# Patient Record
Sex: Female | Born: 1955 | Race: Black or African American | Hispanic: No | Marital: Single | Smoking: Current every day smoker
Health system: Southern US, Community
[De-identification: ages and names within clinical notes are randomized; demographics above are authoritative.]

## PROBLEM LIST (undated history)

## (undated) DIAGNOSIS — E7800 Pure hypercholesterolemia, unspecified: Secondary | ICD-10-CM

## (undated) DIAGNOSIS — I10 Essential (primary) hypertension: Secondary | ICD-10-CM

## (undated) HISTORY — PX: ABDOMINAL HYSTERECTOMY: SHX81

---

## 2012-08-26 DIAGNOSIS — I10 Essential (primary) hypertension: Secondary | ICD-10-CM | POA: Diagnosis present

## 2016-04-17 ENCOUNTER — Emergency Department (HOSPITAL_BASED_OUTPATIENT_CLINIC_OR_DEPARTMENT_OTHER): Payer: 59

## 2016-04-17 ENCOUNTER — Observation Stay (HOSPITAL_BASED_OUTPATIENT_CLINIC_OR_DEPARTMENT_OTHER)
Admission: EM | Admit: 2016-04-17 | Discharge: 2016-04-19 | Disposition: A | Discharge status: Home / Self Care | Payer: 59 | Attending: General Surgery | Admitting: General Surgery

## 2016-04-17 ENCOUNTER — Encounter (HOSPITAL_BASED_OUTPATIENT_CLINIC_OR_DEPARTMENT_OTHER): Payer: Self-pay

## 2016-04-17 DIAGNOSIS — Z7982 Long term (current) use of aspirin: Secondary | ICD-10-CM | POA: Insufficient documentation

## 2016-04-17 DIAGNOSIS — K611 Rectal abscess: Secondary | ICD-10-CM | POA: Diagnosis not present

## 2016-04-17 DIAGNOSIS — F17200 Nicotine dependence, unspecified, uncomplicated: Secondary | ICD-10-CM | POA: Insufficient documentation

## 2016-04-17 DIAGNOSIS — K610 Anal abscess: Secondary | ICD-10-CM

## 2016-04-17 DIAGNOSIS — I10 Essential (primary) hypertension: Secondary | ICD-10-CM | POA: Diagnosis not present

## 2016-04-17 DIAGNOSIS — Z79899 Other long term (current) drug therapy: Secondary | ICD-10-CM | POA: Insufficient documentation

## 2016-04-17 HISTORY — DX: Rectal abscess: K61.1

## 2016-04-17 HISTORY — DX: Essential (primary) hypertension: I10

## 2016-04-17 LAB — BASIC METABOLIC PANEL
ANION GAP: 9 (ref 5–15)
BUN: 12 mg/dL (ref 6–20)
CHLORIDE: 101 mmol/L (ref 101–111)
CO2: 28 mmol/L (ref 22–32)
Calcium: 9.8 mg/dL (ref 8.9–10.3)
Creatinine, Ser: 0.98 mg/dL (ref 0.44–1.00)
GFR calc Af Amer: >60 mL/min (ref >60)
Glucose, Bld: 118 mg/dL — ABNORMAL HIGH (ref 65–99)
POTASSIUM: 3.4 mmol/L — AB (ref 3.5–5.1)
SODIUM: 138 mmol/L (ref 135–145)

## 2016-04-17 LAB — URINALYSIS, ROUTINE W REFLEX MICROSCOPIC
GLUCOSE, UA: NEGATIVE mg/dL
Hgb urine dipstick: NEGATIVE
Ketones, ur: NEGATIVE mg/dL
Leukocytes, UA: NEGATIVE
NITRITE: NEGATIVE
PH: 6 (ref 5.0–8.0)
PROTEIN: 30 mg/dL — AB
Specific Gravity, Urine: 1.023 (ref 1.005–1.030)

## 2016-04-17 LAB — CBC WITH DIFFERENTIAL/PLATELET
Basophils Absolute: 0 10*3/uL (ref 0.0–0.1)
Basophils Relative: 0 %
Eosinophils Absolute: 0.1 10*3/uL (ref 0.0–0.7)
Eosinophils Relative: 1 %
HCT: 34.9 % — ABNORMAL LOW (ref 36.0–46.0)
Hemoglobin: 11.4 g/dL — ABNORMAL LOW (ref 12.0–15.0)
Lymphocytes Relative: 20 %
Lymphs Abs: 2.1 10*3/uL (ref 0.7–4.0)
MCH: 28.8 pg (ref 26.0–34.0)
MCHC: 32.7 g/dL (ref 30.0–36.0)
MCV: 88.1 fL (ref 78.0–100.0)
Monocytes Absolute: 1 10*3/uL (ref 0.1–1.0)
Monocytes Relative: 10 %
Neutro Abs: 7.4 10*3/uL (ref 1.7–7.7)
Neutrophils Relative %: 69 %
Platelets: 328 10*3/uL (ref 150–400)
RBC: 3.96 MIL/uL (ref 3.87–5.11)
RDW: 13.3 % (ref 11.5–15.5)
WBC: 10.6 10*3/uL — ABNORMAL HIGH (ref 4.0–10.5)

## 2016-04-17 LAB — URINALYSIS, MICROSCOPIC (REFLEX)

## 2016-04-17 MED ORDER — MORPHINE SULFATE (PF) 4 MG/ML IV SOLN
INTRAVENOUS | Status: AC
  Administered 2016-04-17: 4 mg via INTRAVENOUS
  Filled 2016-04-17: qty 1

## 2016-04-17 MED ORDER — IOPAMIDOL (ISOVUE-300) INJECTION 61%
100.0000 mL | Freq: Once | INTRAVENOUS | Status: AC | PRN
  Administered 2016-04-17: 100 mL via INTRAVENOUS

## 2016-04-17 MED ORDER — ONDANSETRON HCL 4 MG/2ML IJ SOLN
INTRAMUSCULAR | Status: AC
  Filled 2016-04-17: qty 2

## 2016-04-17 MED ORDER — MORPHINE SULFATE (PF) 4 MG/ML IV SOLN
4.0000 mg | Freq: Once | INTRAVENOUS | Status: AC
  Administered 2016-04-17: 4 mg via INTRAVENOUS

## 2016-04-17 MED ORDER — ONDANSETRON HCL 4 MG/2ML IJ SOLN
4.0000 mg | Freq: Once | INTRAMUSCULAR | Status: AC
  Administered 2016-04-17: 4 mg via INTRAVENOUS

## 2016-04-17 NOTE — ED Notes (Signed)
Pain to right buttocks since Monday and difficulty with urination x 2 days

## 2016-04-17 NOTE — ED Notes (Signed)
Attempted to give report to floor nurse, but unavailable. Per Diplomatic Services operational officersecretary, Drinda ButtsAnnette will have nurse return call.

## 2016-04-17 NOTE — ED Notes (Signed)
Valuables kept :cell phone, pants, shirt, socks, shoes and glasses

## 2016-04-17 NOTE — ED Provider Notes (Signed)
[facility] Provider Note   CSN: [id number] Arrival date & time: 04/17/16  1549   By signing my name below, I, [name], attest that this documentation has been prepared under the direction and in the presence of Newell RubbermaidJeffrey Vermell Madrid, PA-C. Electronically Signed: [name], ED Scribe. 04/17/2016. 12:47 AM.   History   Chief Complaint Chief Complaint  Patient presents with  . Abscess   The history is provided by the patient. No language interpreter was used.   HPI Comments: Wendy Randolph is a 61 y.o. female with PMHx of HTN who presents to the Emergency Department complaining of a moderate, gradually worsening area of pain and swelling to the left buttock x 5 days. Pt states pain is exacerbated with palpation and direct pressure. Pt reports a previous similar abscess to the same area 3 years ago for which she had an I&D. Pt complains of associated difficulty urinating. Pt does not report drainage from the area. Pt denies hx of MRSA. Pt denies fever.    Past Medical History:  Diagnosis Date  . Hypertension     Patient Active Problem List   Diagnosis Date Noted  . Perirectal abscess 04/17/2016    History reviewed. No pertinent surgical history.  OB History    No data available       Home Medications    Prior to Admission medications   Not on File    Family History No family history on file.  Social History Social History  Substance Use Topics  . Smoking status: Current Every Day Smoker  . Smokeless tobacco: Never Used  . Alcohol use Yes     Allergies   Patient has no known allergies.   Review of Systems Review of Systems 10 Systems reviewed and are negative for acute change except as noted in the HPI.   Physical Exam Updated Vital Signs BP 152/72   Pulse 84   Temp 98.8 F (37.1 C) (Oral)   Resp 14   Ht 5\' 7"  (1.702 m)   Wt 68 kg   SpO2 95%   BMI 23.49 kg/m   Physical Exam  Constitutional: She appears  well-developed and well-nourished. No distress.  HENT:  Head: Normocephalic and atraumatic.  Eyes: Conjunctivae are normal.  Cardiovascular: Normal rate.   Pulmonary/Chest: Effort normal.  Abdominal: She exhibits no distension.  Genitourinary:  Genitourinary Comments: Tenderness and firmness to the left perirectal area, no fluctuance noted. Internal exam with severe TTP and fullness along left lateral rectal wall   Neurological: She is alert.  Skin: Skin is warm and dry.  Psychiatric: She has a normal mood and affect.  Nursing note and vitals reviewed.    ED Treatments / Results  DIAGNOSTIC STUDIES:  Oxygen Saturation is 100% on RA, normal by my interpretation.    COORDINATION OF CARE:  5:57 PM Discussed treatment plan with pt at bedside and pt agreed to plan.   Labs (all labs ordered are listed, but only abnormal results are displayed) Labs Reviewed  CBC WITH DIFFERENTIAL/PLATELET - Abnormal; Notable for the following:       Result Value   WBC 10.6 (*)    Hemoglobin 11.4 (*)    HCT 34.9 (*)    All other components within normal limits  URINALYSIS, ROUTINE W REFLEX MICROSCOPIC - Abnormal; Notable for the following:    Color, Urine AMBER (*)    APPearance CLOUDY (*)    Bilirubin Urine SMALL (*)    Protein, ur 30 (*)  All other components within normal limits  URINALYSIS, MICROSCOPIC (REFLEX) - Abnormal; Notable for the following:    Bacteria, UA RARE (*)    Squamous Epithelial / LPF 0-5 (*)    All other components within normal limits  BASIC METABOLIC PANEL - Abnormal; Notable for the following:    Potassium 3.4 (*)    Glucose, Bld 118 (*)    All other components within normal limits    EKG  EKG Interpretation None       Radiology Ct Abdomen Pelvis W Contrast  Result Date: 04/17/2016 CLINICAL DATA:  Left buttock pain and swelling 5 days. Previous abscess in this same area 3 years ago. Difficulty urinating. EXAM: CT ABDOMEN AND PELVIS WITH CONTRAST  TECHNIQUE: Multidetector CT imaging of the abdomen and pelvis was performed using the standard protocol following bolus administration of intravenous contrast. CONTRAST:  ISOVUE-300 IOPAMIDOL (ISOVUE-300) INJECTION 61% COMPARISON:  CT chest 10/14/2015 and CT abdomen/ pelvis 03/21/2006 FINDINGS: Lower chest: Lung bases are within normal. Hepatobiliary: Liver, gallbladder and biliary tree are normal. Pancreas: Within normal. Spleen: Within normal. Adrenals/Urinary Tract: Adrenal glands are normal. Kidneys are normal in size without hydronephrosis or nephrolithiasis. subcentimeter cortical hypodensity over the lower pole left kidney too small to characterize but likely a cyst. Ureters and bladder are normal Stomach/Bowel: Stomach and small bowel are within normal. Appendix is normal. Minimal fatty wall thickening of the cecum/ascending colon without adjacent inflammatory change. There is evidence of a perianal abscess just left of midline measuring 2.3 x 3 x 3 cm and extending slightly superior and anterior to the anorectal junction. Vascular/Lymphatic: Mild calcified plaque throughout the abdominal aorta and iliac arteries. Remaining vascular structures are unremarkable. No significant adenopathy. Reproductive: Previous hysterectomy.  Adnexal regions unremarkable. Other: None. Musculoskeletal: Mild degenerate change of the spine. IMPRESSION: Evidence of a perianal abscess just left of midline extending superiorly and slightly anterior to the anorectal junction. This measures approximately 2.3 x 3 x 3 cm. Evidence of mild fatty wall thickening of the ascending colon as etiology of this abscess may be due to Crohn's disease. Recommend clinical correlation. Minimal diverticulosis of the colon. Aortic atherosclerosis. Subcentimeter left renal cortical hypodensity too small to characterize but likely a cyst. These results were called by telephone at the time of interpretation on 04/17/2016 at 8:56 pm to Dr. Eyvonne Mechanic , who verbally acknowledged these results. Electronically Signed   By: Elberta Fortis M.D.   On: 04/17/2016 20:57    Procedures Procedures (including critical care time)  Medications Ordered in ED Medications  ondansetron (ZOFRAN) 4 MG/2ML injection (not administered)  iopamidol (ISOVUE-300) 61 % injection 100 mL (100 mLs Intravenous Contrast Given 04/17/16 2011)  morphine 4 MG/ML injection 4 mg (4 mg Intravenous Given 04/17/16 2117)  ondansetron (ZOFRAN) injection 4 mg (4 mg Intravenous Given 04/17/16 2116)     Initial Impression / Assessment and Plan / ED Course  I have reviewed the triage vital signs and the nursing notes.  Pertinent labs & imaging results that were available during my care of the patient were reviewed by me and considered in my medical decision making (see chart for details).    Labs: BMP, CBC, urinalysis  Imaging: CT abdomen pelvis with contrast  Consults:   Therapeutics:   Discharge Meds:  Assessment/Plan: 61 year old female presents today with perianal abscess. Patient's abscess was not amenable to I&D in the ED setting. Surgery was consult and who agreed for admission and surgical evaluation. Patient transferred to [facility]  for surgical care.   Final Clinical Impressions(s) / ED Diagnoses   Final diagnoses:  Perianal abscess    New Prescriptions There are no discharge medications for this patient.   I personally performed the services described in this documentation, which was scribed in my presence. The recorded information has been reviewed and is accurate.    Eyvonne Mechanic, PA-C 04/18/16 1610    Canary Brim Tegeler, MD 04/18/16 1102

## 2016-04-17 NOTE — ED Triage Notes (Signed)
Reports abscess to left buttock with increased pain.

## 2016-04-18 ENCOUNTER — Observation Stay (HOSPITAL_COMMUNITY): Payer: 59 | Admitting: Certified Registered Nurse Anesthetist

## 2016-04-18 ENCOUNTER — Encounter (HOSPITAL_COMMUNITY)
Admission: EM | Disposition: A | Discharge status: Home / Self Care | Payer: Self-pay | Source: Home / Self Care | Attending: Emergency Medicine

## 2016-04-18 HISTORY — PX: INCISION AND DRAINAGE PERIRECTAL ABSCESS: SHX1804

## 2016-04-18 LAB — SURGICAL PCR SCREEN
MRSA, PCR: NEGATIVE
STAPHYLOCOCCUS AUREUS: NEGATIVE

## 2016-04-18 SURGERY — INCISION AND DRAINAGE, ABSCESS, PERIRECTAL
Anesthesia: General | Site: Buttocks

## 2016-04-18 MED ORDER — FENTANYL CITRATE (PF) 100 MCG/2ML IJ SOLN
INTRAMUSCULAR | Status: AC
  Filled 2016-04-18: qty 2

## 2016-04-18 MED ORDER — PROPOFOL 10 MG/ML IV BOLUS
INTRAVENOUS | Status: AC
  Filled 2016-04-18: qty 20

## 2016-04-18 MED ORDER — METOPROLOL TARTRATE 5 MG/5ML IV SOLN
5.0000 mg | Freq: Four times a day (QID) | INTRAVENOUS | Status: DC
  Administered 2016-04-18: 5 mg via INTRAVENOUS
  Filled 2016-04-18: qty 5

## 2016-04-18 MED ORDER — POTASSIUM CHLORIDE IN NACL 20-0.45 MEQ/L-% IV SOLN
INTRAVENOUS | Status: DC
  Administered 2016-04-18: 13:00:00 via INTRAVENOUS
  Filled 2016-04-18 (×2): qty 1000

## 2016-04-18 MED ORDER — PROPOFOL 10 MG/ML IV BOLUS
INTRAVENOUS | Status: DC | PRN
  Administered 2016-04-18: 160 mg via INTRAVENOUS

## 2016-04-18 MED ORDER — DEXAMETHASONE SODIUM PHOSPHATE 10 MG/ML IJ SOLN
INTRAMUSCULAR | Status: AC
  Filled 2016-04-18: qty 1

## 2016-04-18 MED ORDER — SODIUM CHLORIDE 0.9 % IJ SOLN
INTRAMUSCULAR | Status: AC
  Filled 2016-04-18: qty 50

## 2016-04-18 MED ORDER — PRAVASTATIN SODIUM 20 MG PO TABS
80.0000 mg | ORAL_TABLET | Freq: Every day | ORAL | Status: DC
  Administered 2016-04-18: 80 mg via ORAL
  Filled 2016-04-18: qty 4

## 2016-04-18 MED ORDER — ONDANSETRON HCL 4 MG/2ML IJ SOLN: 4.0000 mg | Freq: Four times a day (QID) | INTRAMUSCULAR | Status: DC | PRN

## 2016-04-18 MED ORDER — MIDAZOLAM HCL 5 MG/5ML IJ SOLN
INTRAMUSCULAR | Status: DC | PRN
  Administered 2016-04-18: 2 mg via INTRAVENOUS

## 2016-04-18 MED ORDER — DEXTROSE 5 % IV SOLN
2.0000 g | Freq: Two times a day (BID) | INTRAVENOUS | Status: DC
  Administered 2016-04-18: 2 g via INTRAVENOUS
  Filled 2016-04-18 (×2): qty 2

## 2016-04-18 MED ORDER — ONDANSETRON HCL 4 MG/2ML IJ SOLN
INTRAMUSCULAR | Status: DC | PRN
  Administered 2016-04-18: 4 mg via INTRAVENOUS

## 2016-04-18 MED ORDER — BUPIVACAINE LIPOSOME 1.3 % IJ SUSP
20.0000 mL | Freq: Once | INTRAMUSCULAR | Status: AC
  Administered 2016-04-18: 20 mL
  Filled 2016-04-18: qty 20

## 2016-04-18 MED ORDER — LIDOCAINE 2% (20 MG/ML) 5 ML SYRINGE
INTRAMUSCULAR | Status: AC
  Filled 2016-04-18: qty 5

## 2016-04-18 MED ORDER — FENTANYL CITRATE (PF) 100 MCG/2ML IJ SOLN
25.0000 ug | INTRAMUSCULAR | Status: DC | PRN
  Administered 2016-04-18: 50 ug via INTRAVENOUS

## 2016-04-18 MED ORDER — ONDANSETRON 4 MG PO TBDP: 4.0000 mg | ORAL_TABLET | Freq: Four times a day (QID) | ORAL | Status: DC | PRN

## 2016-04-18 MED ORDER — OXYCODONE-ACETAMINOPHEN 5-325 MG PO TABS
1.0000 | ORAL_TABLET | ORAL | Status: DC | PRN
  Filled 2016-04-18: qty 1

## 2016-04-18 MED ORDER — LOSARTAN POTASSIUM 50 MG PO TABS
50.0000 mg | ORAL_TABLET | Freq: Every day | ORAL | Status: DC
  Administered 2016-04-18 – 2016-04-19 (×2): 50 mg via ORAL
  Filled 2016-04-18 (×3): qty 1

## 2016-04-18 MED ORDER — MORPHINE SULFATE (PF) 2 MG/ML IV SOLN
2.0000 mg | INTRAVENOUS | Status: DC | PRN
  Administered 2016-04-18: 2 mg via INTRAVENOUS
  Filled 2016-04-18: qty 1

## 2016-04-18 MED ORDER — DEXAMETHASONE SODIUM PHOSPHATE 10 MG/ML IJ SOLN
INTRAMUSCULAR | Status: DC | PRN
  Administered 2016-04-18: 10 mg via INTRAVENOUS

## 2016-04-18 MED ORDER — ALBUTEROL SULFATE HFA 108 (90 BASE) MCG/ACT IN AERS
INHALATION_SPRAY | RESPIRATORY_TRACT | Status: AC
  Filled 2016-04-18: qty 6.7

## 2016-04-18 MED ORDER — PROMETHAZINE HCL 25 MG/ML IJ SOLN: 6.2500 mg | INTRAMUSCULAR | Status: DC | PRN

## 2016-04-18 MED ORDER — ONDANSETRON HCL 4 MG/2ML IJ SOLN
INTRAMUSCULAR | Status: AC
  Filled 2016-04-18: qty 2

## 2016-04-18 MED ORDER — 0.9 % SODIUM CHLORIDE (POUR BTL) OPTIME
TOPICAL | Status: DC | PRN
  Administered 2016-04-18: 1000 mL

## 2016-04-18 MED ORDER — FENTANYL CITRATE (PF) 100 MCG/2ML IJ SOLN
INTRAMUSCULAR | Status: DC | PRN
  Administered 2016-04-18 (×2): 50 ug via INTRAVENOUS

## 2016-04-18 MED ORDER — ALBUTEROL SULFATE HFA 108 (90 BASE) MCG/ACT IN AERS
INHALATION_SPRAY | RESPIRATORY_TRACT | Status: DC | PRN
  Administered 2016-04-18: 2 via RESPIRATORY_TRACT

## 2016-04-18 MED ORDER — OXYCODONE HCL 5 MG/5ML PO SOLN: 5.0000 mg | Freq: Once | ORAL | Status: DC | PRN

## 2016-04-18 MED ORDER — MIDAZOLAM HCL 2 MG/2ML IJ SOLN
INTRAMUSCULAR | Status: AC
  Filled 2016-04-18: qty 2

## 2016-04-18 MED ORDER — OXYCODONE HCL 5 MG PO TABS: 5.0000 mg | ORAL_TABLET | Freq: Once | ORAL | Status: DC | PRN

## 2016-04-18 MED ORDER — ENOXAPARIN SODIUM 40 MG/0.4ML ~~LOC~~ SOLN
40.0000 mg | SUBCUTANEOUS | Status: DC
  Administered 2016-04-18: 40 mg via SUBCUTANEOUS
  Filled 2016-04-18: qty 0.4

## 2016-04-18 MED ORDER — LIDOCAINE 2% (20 MG/ML) 5 ML SYRINGE
INTRAMUSCULAR | Status: DC | PRN
  Administered 2016-04-18: 80 mg via INTRAVENOUS

## 2016-04-18 MED ORDER — LACTATED RINGERS IV SOLN
INTRAVENOUS | Status: DC
  Administered 2016-04-18: 09:00:00 via INTRAVENOUS

## 2016-04-18 MED ORDER — POTASSIUM CHLORIDE IN NACL 20-0.45 MEQ/L-% IV SOLN
INTRAVENOUS | Status: DC
  Administered 2016-04-18: 02:00:00 via INTRAVENOUS
  Filled 2016-04-18 (×2): qty 1000

## 2016-04-18 SURGICAL SUPPLY — 25 items
BLADE HEX COATED 2.75 (ELECTRODE) ×3 IMPLANT
BLADE SURG 15 STRL LF DISP TIS (BLADE) ×1 IMPLANT
BLADE SURG 15 STRL SS (BLADE) ×2
COVER SURGICAL LIGHT HANDLE (MISCELLANEOUS) IMPLANT
ELECT PENCIL ROCKER SW 15FT (MISCELLANEOUS) ×3 IMPLANT
ELECT REM PT RETURN 9FT ADLT (ELECTROSURGICAL) ×3
ELECTRODE REM PT RTRN 9FT ADLT (ELECTROSURGICAL) ×1 IMPLANT
GAUZE PACKING IODOFORM 1/4X15 (GAUZE/BANDAGES/DRESSINGS) ×3 IMPLANT
GAUZE SPONGE 4X4 12PLY STRL (GAUZE/BANDAGES/DRESSINGS) ×3 IMPLANT
GAUZE SPONGE 4X4 16PLY XRAY LF (GAUZE/BANDAGES/DRESSINGS) ×3 IMPLANT
GLOVE BIOGEL PI IND STRL 7.0 (GLOVE) ×1 IMPLANT
GLOVE BIOGEL PI INDICATOR 7.0 (GLOVE) ×2
GOWN STRL REUS W/TWL LRG LVL3 (GOWN DISPOSABLE) ×3 IMPLANT
GOWN STRL REUS W/TWL XL LVL3 (GOWN DISPOSABLE) ×6 IMPLANT
KIT BASIN OR (CUSTOM PROCEDURE TRAY) ×3 IMPLANT
LUBRICANT JELLY K Y 4OZ (MISCELLANEOUS) ×3 IMPLANT
NEEDLE HYPO 25X1 1.5 SAFETY (NEEDLE) IMPLANT
PACK LITHOTOMY IV (CUSTOM PROCEDURE TRAY) ×3 IMPLANT
PAD ABD 8X10 STRL (GAUZE/BANDAGES/DRESSINGS) ×3 IMPLANT
SOL PREP PROV IODINE SCRUB 4OZ (MISCELLANEOUS) ×3 IMPLANT
SWAB COLLECTION DEVICE MRSA (MISCELLANEOUS) IMPLANT
SYR CONTROL 10ML LL (SYRINGE) IMPLANT
TOWEL OR 17X26 10 PK STRL BLUE (TOWEL DISPOSABLE) ×3 IMPLANT
UNDERPAD 30X30 INCONTINENT (UNDERPADS AND DIAPERS) ×3 IMPLANT
YANKAUER SUCT BULB TIP 10FT TU (MISCELLANEOUS) ×3 IMPLANT

## 2016-04-18 NOTE — Op Note (Signed)
Preoperative Diagnosis: Perianal abscess [K61.0]  Postoprative Diagnosis: Perianal abscess [K61.0]  Procedure: Procedure(s): IRRIGATION AND DRAINAGE PERIRECTAL ABSCESS   Surgeon: Glenna FellowsHoxworth, Adaijah Endres T   Assistants: None  Anesthesia:  General endotracheal anesthesia  Indications: Patient presents with 5 days of worsening left perirectal pain and swelling. She was evaluated in the emergency department and CT scan shows a several centimeter left perirectal abscess. I have recommended incision and drainage in the operating room. The indications of the procedure and its nature, recovery and possible complications have been discussed and documented elsewhere and she agrees to proceed.    Procedure Detail:  Patient was brought to the operating room, placed in the supine position on the operating table and general anesthesia induced. She is carefully positioned in lithotomy position. PAS rate and place. She had received preoperative IV antibiotics. The perineum was widely sterilely prepped and draped. Examination revealed a area of induration in the left lateral perianal . On rectal exam swelling could be felt up 5 or 6 cm proximally. A 1/2 cm incision was made over the area of induration and dissection carried down sharply through subcutaneous tissue alongside of the rectum. Somewhat medially toward the rectal wall and encountered a large cavity with thick white pus and a large amount of pus was evacuated and drained. This opening was enlarged with blunt dissection and cautery to allow wide drainage and any loculations were digitally broken up. The cavity extended about 5 or 6 cm proximally. The cavity was thoroughly irrigated with saline. A perirectal block with Exparel was performed. The cavity was packed with iodoform gauze. Clean gauze dressings were applied. Sponge needle and instrument counts were correct.    Findings: As above  Estimated Blood Loss:  Minimal         Drains: Wound packed with  iodoform gauze  Blood Given: none          Specimens: Culture and sensitivity        Complications:  * No complications entered in OR log *         Disposition: PACU - hemodynamically stable.         Condition: stable

## 2016-04-18 NOTE — Anesthesia Preprocedure Evaluation (Addendum)
Anesthesia Evaluation  Patient identified by MRN, date of birth, ID band Patient awake    Reviewed: Allergy & Precautions, NPO status , Patient's Chart, lab work & pertinent test results  Airway Mallampati: II  TM Distance: >3 FB Neck ROM: Full    Dental no notable dental hx.    Pulmonary Current Smoker,    Pulmonary exam normal breath sounds clear to auscultation       Cardiovascular hypertension, Normal cardiovascular exam Rhythm:Regular Rate:Normal     Neuro/Psych negative neurological ROS  negative psych ROS   GI/Hepatic negative GI ROS, Neg liver ROS,   Endo/Other  negative endocrine ROS  Renal/GU negative Renal ROS  negative genitourinary   Musculoskeletal negative musculoskeletal ROS (+)   Abdominal   Peds negative pediatric ROS (+)  Hematology negative hematology ROS (+)   Anesthesia Other Findings   Reproductive/Obstetrics negative OB ROS                             Anesthesia Physical Anesthesia Plan  ASA: II  Anesthesia Plan: General   Post-op Pain Management:    Induction: Intravenous  Airway Management Planned: LMA and Oral ETT  Additional Equipment:   Intra-op Plan:   Post-operative Plan: Extubation in OR  Informed Consent: I have reviewed the patients History and Physical, chart, labs and discussed the procedure including the risks, benefits and alternatives for the proposed anesthesia with the patient or authorized representative who has indicated his/her understanding and acceptance.   Dental advisory given  Plan Discussed with: CRNA and Surgeon  Anesthesia Plan Comments:         Anesthesia Quick Evaluation

## 2016-04-18 NOTE — Progress Notes (Signed)
Pt refuses new IV placement. IV team as well as myself tried to start a new IV and she said "no it hurts too much." IV fluids are ordered but not being infused. Patient is eating full liquid diet without complications.

## 2016-04-18 NOTE — Transfer of Care (Signed)
Immediate Anesthesia Transfer of Care Note  Patient: Wendy Randolph  Procedure(s) Performed: Procedure(s): IRRIGATION AND DEBRIDEMENT PERIRECTAL ABSCESS (N/A)  Patient Location: PACU  Anesthesia Type:General  Level of Consciousness:  sedated, patient cooperative and responds to stimulation  Airway & Oxygen Therapy:Patient Spontanous Breathing and Patient connected to face mask oxgen  Post-op Assessment:  Report given to PACU RN and Post -op Vital signs reviewed and stable  Post vital signs:  Reviewed and stable  Last Vitals:  Vitals:   04/18/16 0030 04/18/16 0556  BP: (!) 138/47 (!) 113/47  Pulse: 91 77  Resp: 18 18  Temp: 37.1 C 37.1 C    Complications: No apparent anesthesia complications

## 2016-04-18 NOTE — Anesthesia Procedure Notes (Signed)
Procedure Name: LMA Insertion Date/Time: 04/18/2016 9:42 AM Performed by: Wynonia SoursWALKER, Melady Chow L Pre-anesthesia Checklist: Patient identified, Emergency Drugs available, Suction available, Patient being monitored and Timeout performed Patient Re-evaluated:Patient Re-evaluated prior to inductionOxygen Delivery Method: Circle system utilized Preoxygenation: Pre-oxygenation with 100% oxygen Intubation Type: IV induction LMA: LMA with gastric port inserted LMA Size: 4.0 Number of attempts: 1 Placement Confirmation: positive ETCO2,  CO2 detector and breath sounds checked- equal and bilateral Tube secured with: Tape Dental Injury: Teeth and Oropharynx as per pre-operative assessment

## 2016-04-18 NOTE — H&P (Signed)
Wendy Randolph is an 61 y.o. female.    Chief Complaint: Rectal pain  HPI: Patient presents with about 5 days of gradually worsening left-sided buttock or perirectal pain, tenderness and swelling. No drainage. She has had difficulty getting her urine stream started. No change in bowel habits. No drainage or bleeding. She has a history of several buttocks abscess is being drained in the past the last one about 2 years ago. Denies fever or chills.  Past Medical History:  Diagnosis Date  . Hypertension     History reviewed. No pertinent surgical history.  No family history on file. Social History:  reports that she has been smoking.  She has never used smokeless tobacco. She reports that she drinks alcohol. Her drug history is not on file.  Allergies: No Known Allergies  Current Facility-Administered Medications  Medication Dose Route Frequency Provider Last Rate Last Dose  . ondansetron (ZOFRAN) 4 MG/2ML injection            Home medications: Losartan 50 mg daily Aspirin 81 mg daily Pravachol 80 mg daily   Results for orders placed or performed during the hospital encounter of 04/17/16 (from the past 48 hour(s))  Urinalysis, Routine w reflex microscopic     Status: Abnormal   Collection Time: 04/17/16  6:30 PM  Result Value Ref Range   Color, Urine AMBER (A) YELLOW    Comment: BIOCHEMICALS MAY BE AFFECTED BY COLOR   APPearance CLOUDY (A) CLEAR   Specific Gravity, Urine 1.023 1.005 - 1.030   pH 6.0 5.0 - 8.0   Glucose, UA NEGATIVE NEGATIVE mg/dL   Hgb urine dipstick NEGATIVE NEGATIVE   Bilirubin Urine SMALL (A) NEGATIVE   Ketones, ur NEGATIVE NEGATIVE mg/dL   Protein, ur 30 (A) NEGATIVE mg/dL   Nitrite NEGATIVE NEGATIVE   Leukocytes, UA NEGATIVE NEGATIVE  Urinalysis, Microscopic (reflex)     Status: Abnormal   Collection Time: 04/17/16  6:30 PM  Result Value Ref Range   RBC / HPF 0-5 0 - 5 RBC/hpf   WBC, UA 0-5 0 - 5 WBC/hpf   Bacteria, UA RARE (A) NONE SEEN   Squamous  Epithelial / LPF 0-5 (A) NONE SEEN   Mucous PRESENT    Hyaline Casts, UA PRESENT   CBC with Differential     Status: Abnormal   Collection Time: 04/17/16  7:05 PM  Result Value Ref Range   WBC 10.6 (H) 4.0 - 10.5 K/uL   RBC 3.96 3.87 - 5.11 MIL/uL   Hemoglobin 11.4 (L) 12.0 - 15.0 g/dL   HCT 34.9 (L) 36.0 - 46.0 %   MCV 88.1 78.0 - 100.0 fL   MCH 28.8 26.0 - 34.0 pg   MCHC 32.7 30.0 - 36.0 g/dL   RDW 13.3 11.5 - 15.5 %   Platelets 328 150 - 400 K/uL   Neutrophils Relative % 69 %   Neutro Abs 7.4 1.7 - 7.7 K/uL   Lymphocytes Relative 20 %   Lymphs Abs 2.1 0.7 - 4.0 K/uL   Monocytes Relative 10 %   Monocytes Absolute 1.0 0.1 - 1.0 K/uL   Eosinophils Relative 1 %   Eosinophils Absolute 0.1 0.0 - 0.7 K/uL   Basophils Relative 0 %   Basophils Absolute 0.0 0.0 - 0.1 K/uL  Basic metabolic panel     Status: Abnormal   Collection Time: 04/17/16  7:40 PM  Result Value Ref Range   Sodium 138 135 - 145 mmol/L   Potassium 3.4 (L) 3.5 - 5.1  mmol/L   Chloride 101 101 - 111 mmol/L   CO2 28 22 - 32 mmol/L   Glucose, Bld 118 (H) 65 - 99 mg/dL   BUN 12 6 - 20 mg/dL   Creatinine, Ser 0.98 0.44 - 1.00 mg/dL   Calcium 9.8 8.9 - 10.3 mg/dL   GFR calc non Af Amer >60 >60 mL/min   GFR calc Af Amer >60 >60 mL/min    Comment: (NOTE) The eGFR has been calculated using the CKD EPI equation. This calculation has not been validated in all clinical situations. eGFR's persistently <60 mL/min signify possible Chronic Kidney Disease.    Anion gap 9 5 - 15   Ct Abdomen Pelvis W Contrast  Result Date: 04/17/2016 CLINICAL DATA:  Left buttock pain and swelling 5 days. Previous abscess in this same area 3 years ago. Difficulty urinating. EXAM: CT ABDOMEN AND PELVIS WITH CONTRAST TECHNIQUE: Multidetector CT imaging of the abdomen and pelvis was performed using the standard protocol following bolus administration of intravenous contrast. CONTRAST:  135m ISOVUE-300 IOPAMIDOL (ISOVUE-300) INJECTION 61%  COMPARISON:  CT chest 10/14/2015 and CT abdomen/ pelvis 03/21/2006 FINDINGS: Lower chest: Lung bases are within normal. Hepatobiliary: Liver, gallbladder and biliary tree are normal. Pancreas: Within normal. Spleen: Within normal. Adrenals/Urinary Tract: Adrenal glands are normal. Kidneys are normal in size without hydronephrosis or nephrolithiasis. subcentimeter cortical hypodensity over the lower pole left kidney too small to characterize but likely a cyst. Ureters and bladder are normal Stomach/Bowel: Stomach and small bowel are within normal. Appendix is normal. Minimal fatty wall thickening of the cecum/ascending colon without adjacent inflammatory change. There is evidence of a perianal abscess just left of midline measuring 2.3 x 3 x 3 cm and extending slightly superior and anterior to the anorectal junction. Vascular/Lymphatic: Mild calcified plaque throughout the abdominal aorta and iliac arteries. Remaining vascular structures are unremarkable. No significant adenopathy. Reproductive: Previous hysterectomy.  Adnexal regions unremarkable. Other: None. Musculoskeletal: Mild degenerate change of the spine. IMPRESSION: Evidence of a perianal abscess just left of midline extending superiorly and slightly anterior to the anorectal junction. This measures approximately 2.3 x 3 x 3 cm. Evidence of mild fatty wall thickening of the ascending colon as etiology of this abscess may be due to Crohn's disease. Recommend clinical correlation. Minimal diverticulosis of the colon. Aortic atherosclerosis. Subcentimeter left renal cortical hypodensity too small to characterize but likely a cyst. These results were called by telephone at the time of interpretation on 04/17/2016 at 8:56 pm to Dr. JOkey Regal, who verbally acknowledged these results. Electronically Signed   By: DMarin OlpM.D.   On: 04/17/2016 20:57    Review of Systems  Constitutional: Negative for chills and fever.  Respiratory: Negative.    Cardiovascular: Negative.   Gastrointestinal: Negative for abdominal pain, blood in stool, constipation, diarrhea, nausea and vomiting.  Genitourinary:       Difficulty voiding    Blood pressure (!) 138/47, pulse 91, temperature 98.7 F (37.1 C), temperature source Oral, resp. rate 18, height 5' 7"  (1.702 m), weight 73.4 kg (161 lb 13.1 oz), SpO2 96 %. Physical Exam  General: Alert, mildly obese African-American female, in no distress Skin: Warm and dry without rash or infection. HEENT: No palpable masses or thyromegaly. Sclera nonicteric.  Lymph nodes: No cervical, supraclavicular, or inguinal nodes palpable. Lungs: Breath sounds clear and equal without increased work of breathing Cardiovascular: Regular rate and rhythm without murmur. No JVD or edema. Peripheral pulses intact. Abdomen: Nondistended. Soft and nontender. No  masses palpable. No organomegaly. No palpable hernias. Rectal: External rectal exam reveals mild swelling and induration and tenderness in the left perirectal space. There are several healed scars over the lateral left buttocks. Extremities: No edema or joint swelling or deformity. No chronic venous stasis changes. Neurologic: Alert and fully oriented. Affect normal. No gross motor deficits.  Assessment/Plan Perirectal abscess. She has had previous buttock abscesses but these seem well lateral to the rectum and likely were skin abscesses. Patient is being started on IV antibiotics and plan for urgent incision and drainage. I discussed the procedure in nature the problem with the patient including expected recovery and risks of anesthetic complications, bleeding and infection.  Edward Jolly, MD 04/18/2016, 1:07 AM

## 2016-04-19 ENCOUNTER — Encounter (HOSPITAL_COMMUNITY): Payer: Self-pay | Admitting: General Surgery

## 2016-04-19 MED ORDER — LOSARTAN POTASSIUM 50 MG PO TABS
50.0000 mg | ORAL_TABLET | Freq: Once | ORAL | Status: AC
  Administered 2016-04-19: 50 mg via ORAL

## 2016-04-19 MED ORDER — OXYCODONE-ACETAMINOPHEN 5-325 MG PO TABS
1.0000 | ORAL_TABLET | Freq: Three times a day (TID) | ORAL | 0 refills | Status: DC | PRN
Start: 2016-04-19 — End: 2022-05-09

## 2016-04-19 MED ORDER — AMOXICILLIN-POT CLAVULANATE 875-125 MG PO TABS: 1.0000 | ORAL_TABLET | Freq: Two times a day (BID) | ORAL | 0 refills | Status: DC

## 2016-04-19 NOTE — Anesthesia Postprocedure Evaluation (Addendum)
Anesthesia Post Note  Patient: [name]  Procedure(s) Performed: Procedure(s) (LRB): IRRIGATION AND DEBRIDEMENT PERIRECTAL ABSCESS (N/A)  Patient location during evaluation: PACU Anesthesia Type: General Level of consciousness: awake and alert Pain management: pain level controlled Vital Signs Assessment: post-procedure vital signs reviewed and stable Respiratory status: spontaneous breathing, nonlabored ventilation, respiratory function stable and patient connected to nasal cannula oxygen Cardiovascular status: blood pressure returned to baseline and stable Postop Assessment: no signs of nausea or vomiting Anesthetic complications: no       Last Vitals:  Vitals:   04/19/16 0505 04/19/16 0657  BP: (!) 162/78 (!) 157/66  Pulse: 94   Resp: 18   Temp: 36.4 C     Last Pain:  Vitals:   04/19/16 0505  TempSrc: Oral  PainSc:                  Ebin Palazzi S

## 2016-04-19 NOTE — Progress Notes (Signed)
Patient ID: Wendy Randolph, female   DOB: 09-26-1955, 61 y.o.   MRN: [id number]  [facility]  1 Day Post-Op  Subjective: Feeling well this morning. Tolerating soft diet. Pain well controlled. Wants to go home.  Objective: Vital signs in last 24 hours: Temp:  [97.6 F (36.4 C)-98.6 F (37 C)] 97.6 F (36.4 C) (01/29 0505) Pulse Rate:  [66-94] 94 (01/29 0505) Resp:  [11-18] 18 (01/29 0505) BP: (124-162)/(64-100) 157/66 (01/29 0657) SpO2:  [91 %-100 %] 98 % (01/29 0505) Last BM Date: 04/18/16  Intake/Output from previous day: 01/28 0701 - 01/29 0700 In: 1883.3 [P.O.:720; I.V.:1163.3] Out: 2100 [Urine:2100] Intake/Output this shift: No intake/output data recorded.  PE: Gen:  Alert, NAD, pleasant Pulm:  Effort normal Abd: Soft, NT/ND, +BS Perineum: left sided perianal abscess s/p I&D >> packing removed, no drainage, no induration  Lab Results:   Recent Labs  04/17/16 1905  WBC 10.6*  HGB 11.4*  HCT 34.9*  PLT 328   BMET  Recent Labs  04/17/16 1940  NA 138  K 3.4*  CL 101  CO2 28  GLUCOSE 118*  BUN 12  CREATININE 0.98  CALCIUM 9.8   PT/INR No results for input(s): LABPROT, INR in the last 72 hours. CMP     Component Value Date/Time   NA 138 04/17/2016 1940   K 3.4 (L) 04/17/2016 1940   CL 101 04/17/2016 1940   CO2 28 04/17/2016 1940   GLUCOSE 118 (H) 04/17/2016 1940   BUN 12 04/17/2016 1940   CREATININE 0.98 04/17/2016 1940   CALCIUM 9.8 04/17/2016 1940   GFRNONAA >60 04/17/2016 1940   GFRAA >60 04/17/2016 1940   Lipase  No results found for: LIPASE     Studies/Results: Ct Abdomen Pelvis W Contrast  Result Date: 04/17/2016 CLINICAL DATA:  Left buttock pain and swelling 5 days. Previous abscess in this same area 3 years ago. Difficulty urinating. EXAM: CT ABDOMEN AND PELVIS WITH CONTRAST TECHNIQUE: Multidetector CT imaging of the abdomen and pelvis was performed using the standard protocol following bolus  administration of intravenous contrast. CONTRAST:  100mL ISOVUE-300 IOPAMIDOL (ISOVUE-300) INJECTION 61% COMPARISON:  CT chest 10/14/2015 and CT abdomen/ pelvis 03/21/2006 FINDINGS: Lower chest: Lung bases are within normal. Hepatobiliary: Liver, gallbladder and biliary tree are normal. Pancreas: Within normal. Spleen: Within normal. Adrenals/Urinary Tract: Adrenal glands are normal. Kidneys are normal in size without hydronephrosis or nephrolithiasis. subcentimeter cortical hypodensity over the lower pole left kidney too small to characterize but likely a cyst. Ureters and bladder are normal Stomach/Bowel: Stomach and small bowel are within normal. Appendix is normal. Minimal fatty wall thickening of the cecum/ascending colon without adjacent inflammatory change. There is evidence of a perianal abscess just left of midline measuring 2.3 x 3 x 3 cm and extending slightly superior and anterior to the anorectal junction. Vascular/Lymphatic: Mild calcified plaque throughout the abdominal aorta and iliac arteries. Remaining vascular structures are unremarkable. No significant adenopathy. Reproductive: Previous hysterectomy.  Adnexal regions unremarkable. Other: None. Musculoskeletal: Mild degenerate change of the spine. IMPRESSION: Evidence of a perianal abscess just left of midline extending superiorly and slightly anterior to the anorectal junction. This measures approximately 2.3 x 3 x 3 cm. Evidence of mild fatty wall thickening of the ascending colon as etiology of this abscess may be due to Crohn's disease. Recommend clinical correlation. Minimal diverticulosis of the colon. Aortic atherosclerosis. Subcentimeter left renal cortical hypodensity too small to characterize but likely a cyst. These results were called by  telephone at the time of interpretation on 04/17/2016 at 8:56 pm to Dr. Eyvonne Mechanic , who verbally acknowledged these results. Electronically Signed   By: Elberta Fortis M.D.   On: 04/17/2016 20:57     Anti-infectives: Anti-infectives    Start     Dose/Rate Route Frequency Ordered Stop   04/18/16 0200  cefoTEtan (CEFOTAN) 2 g in dextrose 5 % 50 mL IVPB  Status:  Discontinued     2 g 100 mL/hr over 30 Minutes Intravenous Every 12 hours 04/18/16 0133 04/18/16 1132       Assessment/Plan S/p IRRIGATION AND DRAINAGE PERIRECTAL ABSCESS 1/28 Dr. Johna Sheriff - POD 1 - culture pending, so far no organisms seen  ID - cefotetan x1 perioperative FEN - regular diet VTE - SCDs, lovenox  Plan - packing removed. Start sitz baths BID. Ready for discharge. Patient will be on augmentin x1 week and she will follow-up with Dr. Johna Sheriff in 1-2 weeks.   LOS: 1 day    Edson Snowball , [facility] 04/19/2016, 9:37 AM Pager: [phone] Consults: [phone] Mon-Fri 7:00 am-4:30 pm Sat-Sun 7:00 am-11:30 am

## 2016-04-19 NOTE — Progress Notes (Signed)
MD paged about patient BP 162/78 pulse 94 and feeling like she has a head cold. Pt is due to restart blood pressure medications at 10 in the morning. Received verbal orders to give losartan now and keep on schedule for 10am.

## 2016-04-19 NOTE — Discharge Summary (Signed)
[facility] Discharge Summary   Patient ID: Wendy EeCarolyn Byrom MRN: [id number] DOB/AGE: 1956-02-09 61 y.o.  Admit date: 04/17/2016 Discharge date: 04/19/2016  Admitting Diagnosis: Perianal abscess  Discharge Diagnosis Patient Active Problem List   Diagnosis Date Noted  . Perirectal abscess 04/17/2016    Consultants None  Imaging: Ct Abdomen Pelvis W Contrast  Result Date: 04/17/2016 CLINICAL DATA:  Left buttock pain and swelling 5 days. Previous abscess in this same area 3 years ago. Difficulty urinating. EXAM: CT ABDOMEN AND PELVIS WITH CONTRAST TECHNIQUE: Multidetector CT imaging of the abdomen and pelvis was performed using the standard protocol following bolus administration of intravenous contrast. CONTRAST:  100mL ISOVUE-300 IOPAMIDOL (ISOVUE-300) INJECTION 61% COMPARISON:  CT chest 10/14/2015 and CT abdomen/ pelvis 03/21/2006 FINDINGS: Lower chest: Lung bases are within normal. Hepatobiliary: Liver, gallbladder and biliary tree are normal. Pancreas: Within normal. Spleen: Within normal. Adrenals/Urinary Tract: Adrenal glands are normal. Kidneys are normal in size without hydronephrosis or nephrolithiasis. subcentimeter cortical hypodensity over the lower pole left kidney too small to characterize but likely a cyst. Ureters and bladder are normal Stomach/Bowel: Stomach and small bowel are within normal. Appendix is normal. Minimal fatty wall thickening of the cecum/ascending colon without adjacent inflammatory change. There is evidence of a perianal abscess just left of midline measuring 2.3 x 3 x 3 cm and extending slightly superior and anterior to the anorectal junction. Vascular/Lymphatic: Mild calcified plaque throughout the abdominal aorta and iliac arteries. Remaining vascular structures are unremarkable. No significant adenopathy. Reproductive: Previous hysterectomy.  Adnexal regions unremarkable. Other: None. Musculoskeletal: Mild degenerate change of the spine.  IMPRESSION: Evidence of a perianal abscess just left of midline extending superiorly and slightly anterior to the anorectal junction. This measures approximately 2.3 x 3 x 3 cm. Evidence of mild fatty wall thickening of the ascending colon as etiology of this abscess may be due to Crohn's disease. Recommend clinical correlation. Minimal diverticulosis of the colon. Aortic atherosclerosis. Subcentimeter left renal cortical hypodensity too small to characterize but likely a cyst. These results were called by telephone at the time of interpretation on 04/17/2016 at 8:56 pm to Dr. Eyvonne MechanicJEFFREY HEDGES , who verbally acknowledged these results. Electronically Signed   By: Elberta Fortisaniel  Boyle M.D.   On: 04/17/2016 20:57    Procedures Dr. Johna SheriffHoxworth (04/19/16) - Irrigation and drainage perirectal abscess  Hospital Course:  Wendy Randolph is a 61yo female [facility] 5 days of gradually worsening left-sided buttock pain.  She has a history of several buttocks abscess being drained in the past, the last one about 2 years ago. Workup showed perirectal abscess.  Patient was admitted for IV antibiotics, and she underwent procedure listed above.  Tolerated procedure well and was transferred to the floor.  Diet was advanced as tolerated.  On POD1 packing was removed from wound. The patient was voiding well, tolerating diet, ambulating well, pain well controlled, vital signs stable and felt stable for discharge home. She will start sitz baths and be on augmentin x1 week.  Patient will follow up in our office in 2 weeks and knows to call with questions or concerns.  She will call to confirm appointment date/time.    Physical Exam: Gen:  Alert, NAD, pleasant Pulm:  Effort normal Abd: Soft, NT/ND, +BS Perineum: left sided perianal abscess s/p I&D >> packing removed, no drainage, no induration  Allergies as of 04/19/2016   No Known Allergies     Medication List    TAKE these medications  amoxicillin-clavulanate 875-125 MG tablet Commonly known as:  AUGMENTIN Take 1 tablet by mouth 2 (two) times daily.   aspirin EC 81 MG tablet Take 81 mg by mouth daily.   losartan 50 MG tablet Commonly known as:  COZAAR Take 50 mg by mouth daily.   naphazoline-glycerin 0.012-0.2 % Soln Commonly known as:  CLEAR EYES Place 1-2 drops into both eyes 4 (four) times daily as needed for irritation.   oxyCODONE-acetaminophen 5-325 MG tablet Commonly known as:  PERCOCET/ROXICET Take 1-2 tablets by mouth every 8 (eight) hours as needed for moderate pain.   pravastatin 80 MG tablet Commonly known as:  PRAVACHOL Take 80 mg by mouth daily.        Follow-up Information    Mariella Saa, MD. Call.   Specialty:  General Surgery Why:  We are working on your appointment, please call to confirm. We would like for you to see Dr. Johna Sheriff in about 2 weeks. Contact information: [address] [place] [address] [phone]           Signed: Edson Snowball, [facility] 04/19/2016, 10:16 AM Pager: [phone] Consults: [phone] Mon-Fri 7:00 am-4:30 pm Sat-Sun 7:00 am-11:30 am

## 2016-04-19 NOTE — Discharge Instructions (Signed)
Disposable Sitz Bath °Introduction °A disposable sitz bath is a plastic basin that fits over the toilet. A bag is hung above the toilet, and the bag is connected to a tube that opens into the basin. The bag is filled with warm water that flows into the basin through the tube. A sitz bath can be used to help relieve symptoms, clean, and promote healing in the genital and anal areas, as well as in the lower abdomen and buttocks. °What are the risks? °Sitz baths are generally very safe. It is possible for the skin between the genitals and the anus (perineum) to become infected, but this is rare. You can avoid this by cleaning your sitz bath supplies thoroughly. °How to use a disposable sitz bath °1. Close the clamp on the tube. Make sure the clamp is closed tightly to prevent leakage. °2. Fill the sitz bath basin and the plastic bag with warm water. The water should be warm enough to be comfortable, but not hot. °3. Raise the toilet seat and place the filled basin on the toilet. Make sure the overflow opening is facing toward the back of the toilet. °¨ If you prefer, you may place the empty basin on the toilet first, and then use the plastic bag to fill the basin with warm water. °4. Hang the filled plastic bag overhead on a hook or towel rack close to the toilet. The bag should be higher than the toilet so that the water will flow down through the tube. °5. Attach the tube to the opening on the basin. Make sure that the tube is attached to the basin tightly to prevent leakage. °6. Sit on the basin and release the clamp. This will allow warm water to flow into the basin and flush the area around your genitals and anus. °7. Remain sitting on the basin for about 15-20 minutes, or as long as told by your health care provider. °8. Stand up and gently pat your skin dry. If directed, apply clean bandages (dressings) to the affected area as told by your health care provider. °9. Carefully remove the basin from the toilet seat  and tip the basin into the toilet to empty any remaining water. Empty any remaining water from the plastic bag into the toilet. Then, flush the toilet. °10. Wash the basin with warm water and soap. Let the basin air dry in the sink. You should also let the plastic bag and the tubing air dry. °11. Store the basin, tubing, and plastic bag in a clean, dry area. °12. Wash your hands with soap and water. If soap and water are not available, use hand sanitizer. °Contact a health care provider if: °· You have symptoms that get worse instead of better. °· You develop new skin irritation, redness, or swelling around your genitals or anus. °This information is not intended to replace advice given to you by your health care provider. Make sure you discuss any questions you have with your health care provider. °Document Released: 09/07/2011 Document Revised: 08/14/2015 Document Reviewed: 01/26/2015 °© 2017 Elsevier ° °

## 2016-04-25 LAB — AEROBIC/ANAEROBIC CULTURE (SURGICAL/DEEP WOUND)

## 2016-04-25 LAB — AEROBIC/ANAEROBIC CULTURE W GRAM STAIN (SURGICAL/DEEP WOUND)

## 2016-08-23 NOTE — Addendum Note (Signed)
Addendum  created 08/23/16 1032 by Zaylie Gisler, MD   Sign clinical note    

## 2018-07-26 IMAGING — CT CT ABD-PELV W/ CM
2 of 5 series · 15 of 46 positions shown, 17 images · IV contrast (iopamidol)
Comparison: CT chest 10/14/2015 and CT abdomen/ pelvis 03/21/2006

CLINICAL DATA: Left buttock pain and swelling 5 days. Previous
abscess in this same area 3 years ago. Difficulty urinating.

EXAM:
CT ABDOMEN AND PELVIS WITH CONTRAST
TECHNIQUE: Multidetector CT imaging of the abdomen and pelvis was performed
using the standard protocol following bolus administration of
intravenous contrast.
CONTRAST:  100mL VW0PXE-T55 IOPAMIDOL (VW0PXE-T55) INJECTION 61%

[Series 2: axial st · axial · 0.81mm/px · z∈[-881,-471]mm · 12 of 92 slices shown, 14 images]
[im 5/92  soft-tissue]
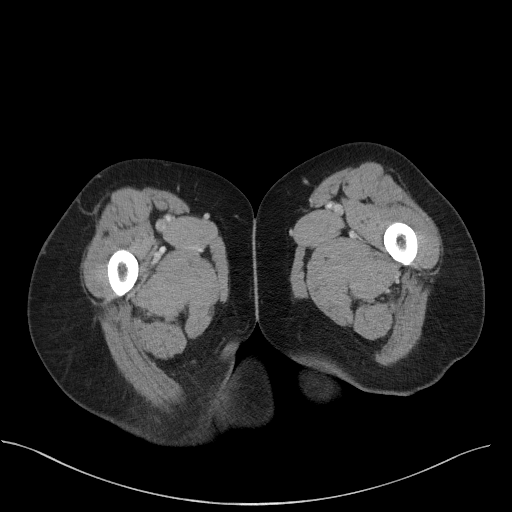
[im 5/92  bone]
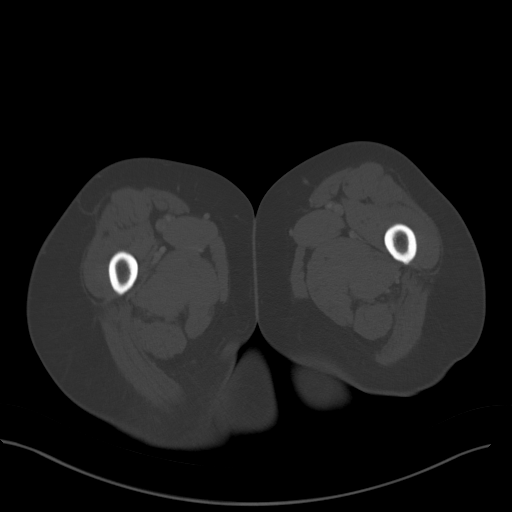
[im 14/92  soft-tissue]
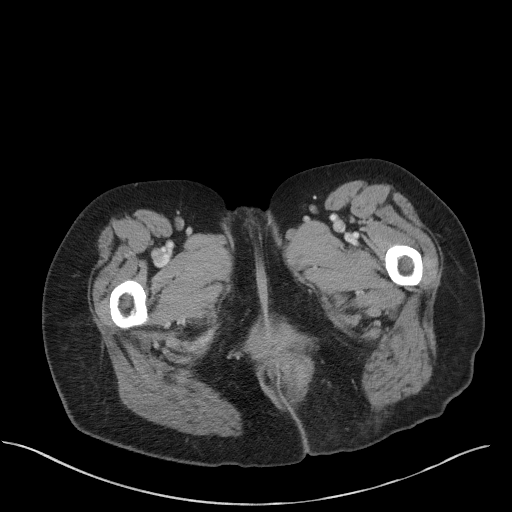
[im 19/92  soft-tissue]
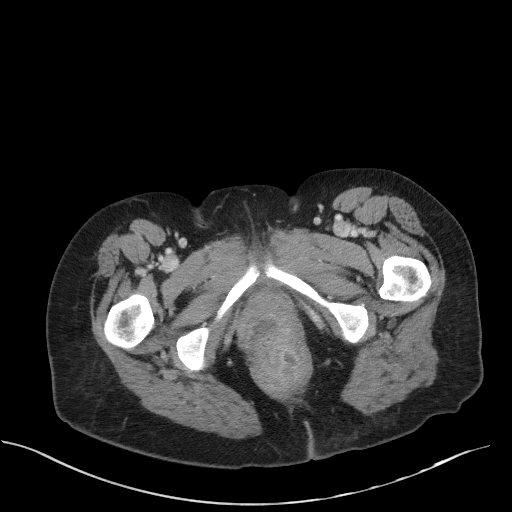
[im 28/92  soft-tissue]
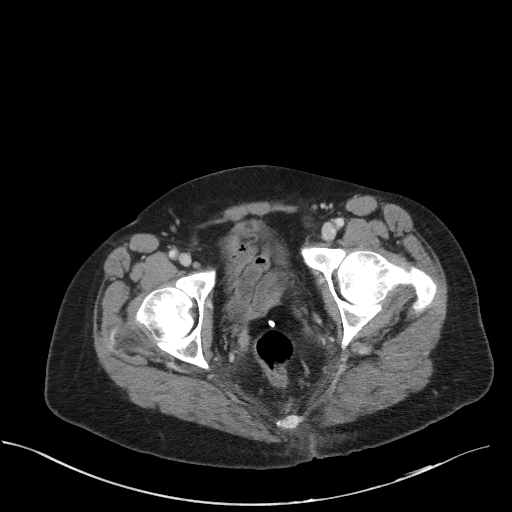
[im 37/92  soft-tissue]
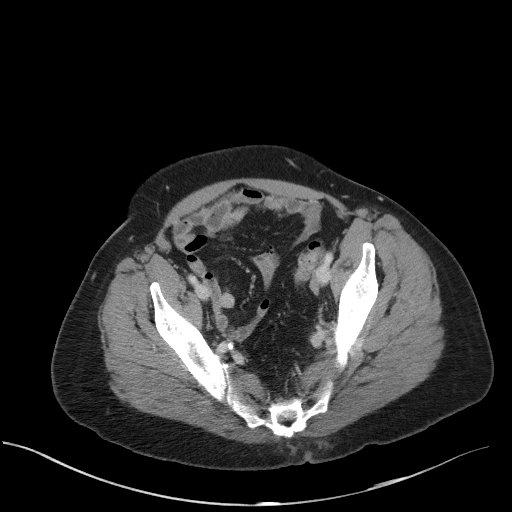
[im 41/92  soft-tissue]
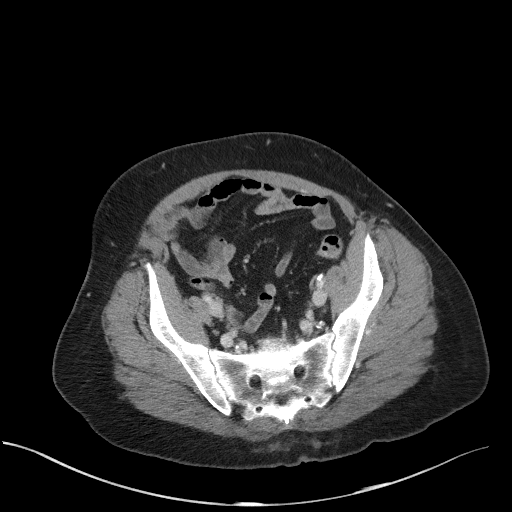
[im 51/92  soft-tissue]
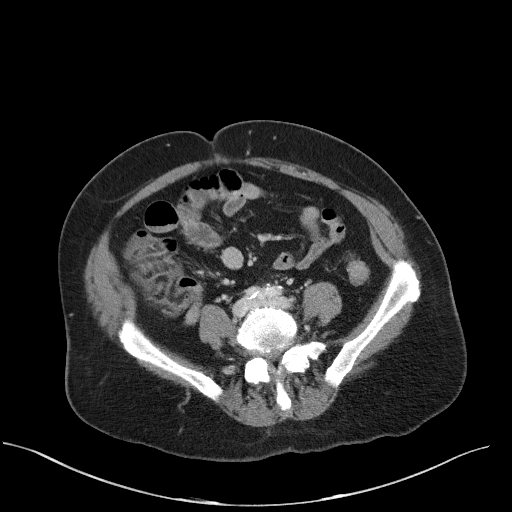
[im 55/92  soft-tissue]
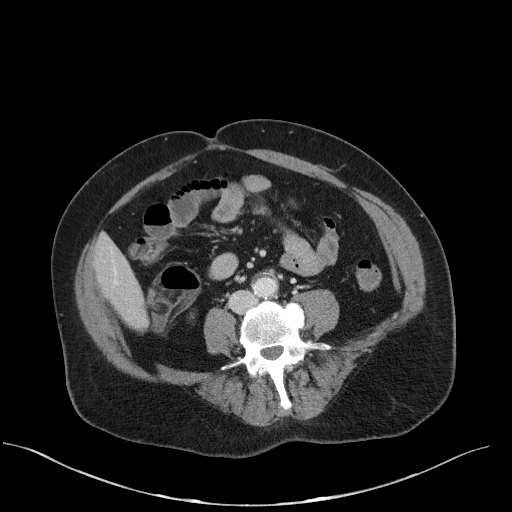
[im 64/92  soft-tissue]
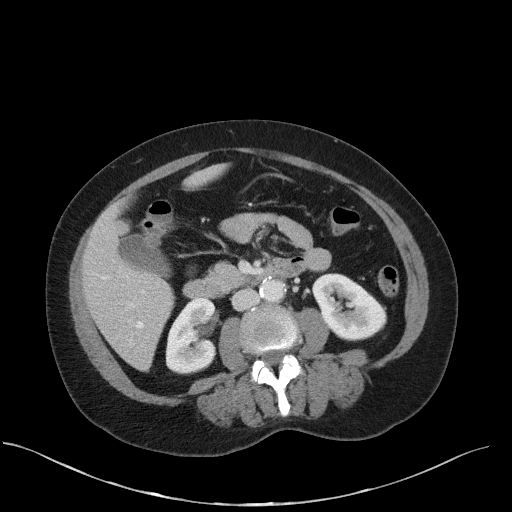
[im 64/92  bone]
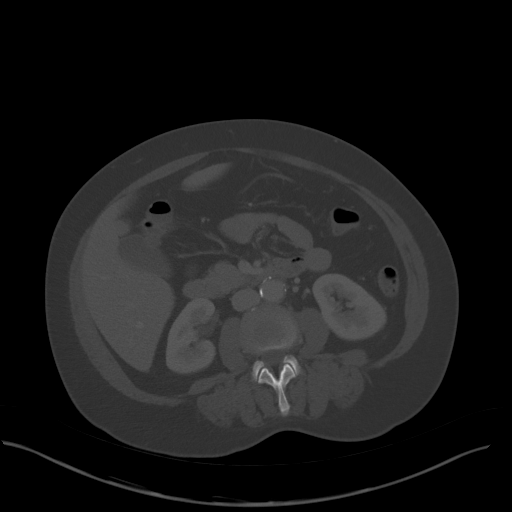
[im 73/92  soft-tissue]
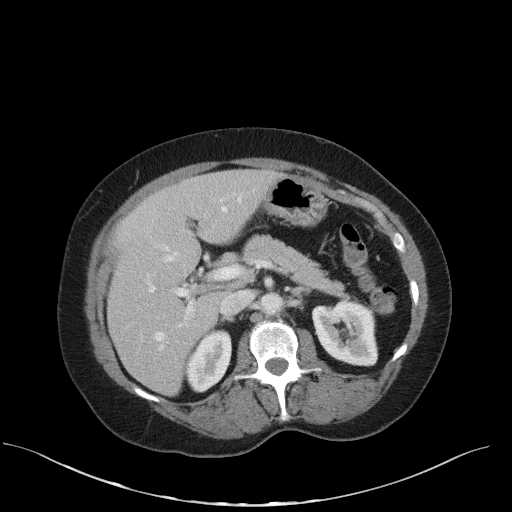
[im 78/92  soft-tissue]
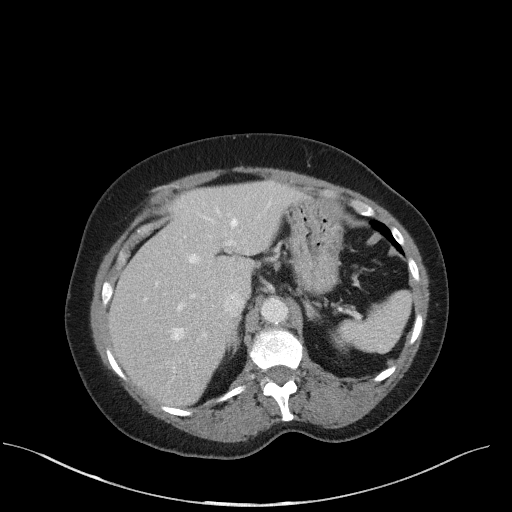
[im 87/92  soft-tissue]
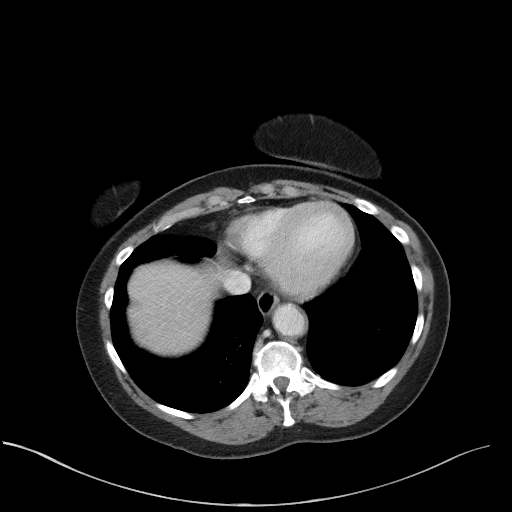

[Series 5: coronal st · coronal · 0.67mm/px · 3 of 105 slices shown]
[im 35/105  soft-tissue]
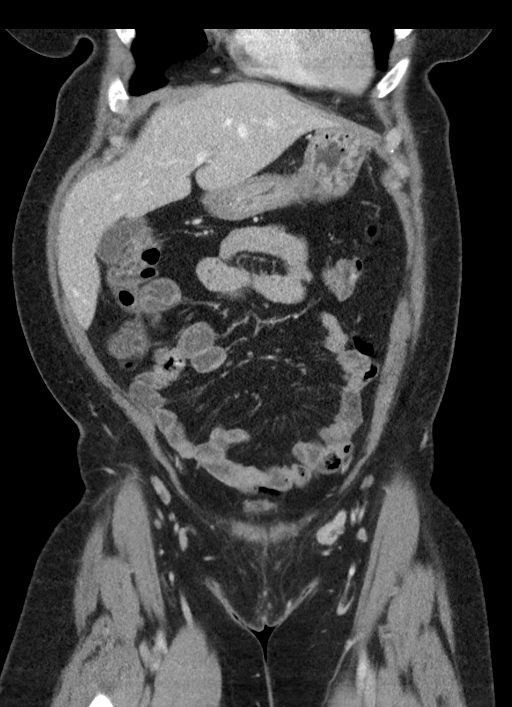
[im 47/105  soft-tissue]
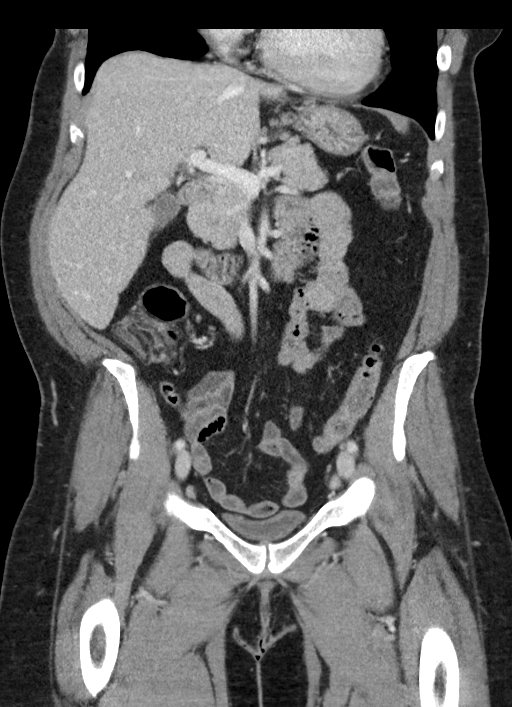
[im 58/105  soft-tissue]
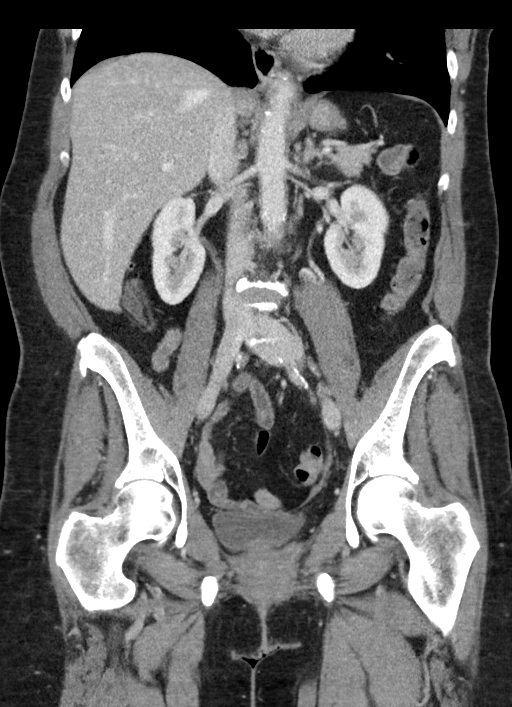

[15 of 46 positions shown; findings below may reference images not displayed]

FINDINGS: Lower chest: Lung bases are within normal.

Hepatobiliary: Liver, gallbladder and biliary tree are normal.

Pancreas: Within normal.

Spleen: Within normal.

Adrenals/Urinary Tract: Adrenal glands are normal. Kidneys are
normal in size without hydronephrosis or nephrolithiasis.
subcentimeter cortical hypodensity over the lower pole left kidney
too small to characterize but likely a cyst. Ureters and bladder are
normal

Stomach/Bowel: Stomach and small bowel are within normal. Appendix
is normal.

Minimal fatty wall thickening of the cecum/ascending colon without
adjacent inflammatory change. There is evidence of a perianal
abscess just left of midline measuring 2.3 x 3 x 3 cm and extending
slightly superior and anterior to the anorectal junction.

Vascular/Lymphatic: Mild calcified plaque throughout the abdominal
aorta and iliac arteries. Remaining vascular structures are
unremarkable. No significant adenopathy.

Reproductive: Previous hysterectomy.  Adnexal regions unremarkable.

Other: None.

Musculoskeletal: Mild degenerate change of the spine.
IMPRESSION: Evidence of a perianal abscess just left of midline extending
superiorly and slightly anterior to the anorectal junction. This
measures approximately 2.3 x 3 x 3 cm. Evidence of mild fatty wall
thickening of the ascending colon as etiology of this abscess may be
due to Crohn's disease. Recommend clinical correlation.

Minimal diverticulosis of the colon.

Aortic atherosclerosis.

Subcentimeter left renal cortical hypodensity too small to
characterize but likely a cyst.

These results were called by telephone at the time of interpretation
on 04/17/2016 at [DATE] to Dr. LENG GERHART , who verbally
acknowledged these results.

## 2019-03-20 DIAGNOSIS — E782 Mixed hyperlipidemia: Secondary | ICD-10-CM | POA: Diagnosis present

## 2020-05-22 LAB — COLOGUARD: COLOGUARD: NEGATIVE

## 2021-02-20 DIAGNOSIS — K219 Gastro-esophageal reflux disease without esophagitis: Secondary | ICD-10-CM | POA: Insufficient documentation

## 2022-01-11 ENCOUNTER — Emergency Department (HOSPITAL_BASED_OUTPATIENT_CLINIC_OR_DEPARTMENT_OTHER)
Admission: EM | Admit: 2022-01-11 | Discharge: 2022-01-11 | Disposition: A | Discharge status: Home / Self Care | Payer: Medicare Other | Attending: Emergency Medicine | Admitting: Emergency Medicine

## 2022-01-11 ENCOUNTER — Emergency Department (HOSPITAL_BASED_OUTPATIENT_CLINIC_OR_DEPARTMENT_OTHER): Payer: Medicare Other

## 2022-01-11 ENCOUNTER — Other Ambulatory Visit: Payer: Self-pay

## 2022-01-11 ENCOUNTER — Encounter (HOSPITAL_BASED_OUTPATIENT_CLINIC_OR_DEPARTMENT_OTHER): Payer: Self-pay | Admitting: Emergency Medicine

## 2022-01-11 DIAGNOSIS — W010XXA Fall on same level from slipping, tripping and stumbling without subsequent striking against object, initial encounter: Secondary | ICD-10-CM | POA: Diagnosis not present

## 2022-01-11 DIAGNOSIS — M25561 Pain in right knee: Secondary | ICD-10-CM | POA: Diagnosis present

## 2022-01-11 DIAGNOSIS — Y9301 Activity, walking, marching and hiking: Secondary | ICD-10-CM | POA: Insufficient documentation

## 2022-01-11 HISTORY — DX: Pure hypercholesterolemia, unspecified: E78.00

## 2022-01-11 NOTE — ED Triage Notes (Signed)
Patient presents POV with R knee pain after tripping and landing on R knee. No obvious deformity, ambulatory. Has not attempted any OTC measures for pain relief.

## 2022-01-11 NOTE — ED Provider Notes (Signed)
[facility] EMERGENCY DEPARTMENT Provider Note   CSN: [id number] Arrival date & time: 01/11/22  0848     History  Chief Complaint  Patient presents with   Knee Pain    Wendy Randolph is a 66 y.o. female.  66 yo F with a chief complaints of right knee pain after fall.  Patient was walking up her stairs a couple days ago and lost her balance and fell.  Complaining of pain to that knee.  Has been able to ambulate without issue.  Denies other injury in the fall.  Has never had problems with this knee before.  Has been feeling some popping and clicks and was concerned so came here for evaluation.   Knee Pain      Home Medications Prior to Admission medications   Medication Sig Start Date End Date Taking? Authorizing Provider  amoxicillin-clavulanate (AUGMENTIN) 875-125 MG tablet Take 1 tablet by mouth 2 (two) times daily. 04/19/16   Meuth, Brooke A, PA-C  losartan (COZAAR) 50 MG tablet Take 50 mg by mouth daily.  09/26/15 09/25/16  [provider]  naphazoline-glycerin (CLEAR EYES) 0.012-0.2 % SOLN Place 1-2 drops into both eyes 4 (four) times daily as needed for irritation.    [provider]  oxyCODONE-acetaminophen (PERCOCET/ROXICET) 5-325 MG tablet Take 1-2 tablets by mouth every 8 (eight) hours as needed for moderate pain. 04/19/16   Meuth, Brooke A, PA-C  pravastatin (PRAVACHOL) 80 MG tablet Take 80 mg by mouth daily.  09/29/15 09/28/16  [provider]      Allergies    Patient has no known allergies.    Review of Systems   Review of Systems  Physical Exam Updated Vital Signs BP (!) 188/88 (BP Location: Right Arm)   Pulse 89   Temp 98.5 F (36.9 C) (Oral)   Resp 16   Ht 5\' 7"  (1.702 m)   Wt 61.2 kg   SpO2 98%   BMI 21.14 kg/m  Physical Exam Vitals and nursing note reviewed.  Constitutional:      General: She is not in acute distress.    Appearance: She is well-developed. She is not diaphoretic.  HENT:     Head: Normocephalic  and atraumatic.  Eyes:     Pupils: Pupils are equal, round, and reactive to light.  Cardiovascular:     Rate and Rhythm: Normal rate and regular rhythm.     Heart sounds: No murmur heard.    No friction rub. No gallop.  Pulmonary:     Effort: Pulmonary effort is normal.     Breath sounds: No wheezing or rales.  Abdominal:     General: There is no distension.     Palpations: Abdomen is soft.     Tenderness: There is no abdominal tenderness.  Musculoskeletal:        General: No tenderness.     Cervical back: Normal range of motion and neck supple.     Comments: Pulse motor and sensation intact.  Full range of motion without significant tenderness.  She does have some mild ligamentous laxity with medial distraction of the knee.  Skin:    General: Skin is warm and dry.  Neurological:     Mental Status: She is alert and oriented to person, place, and time.  Psychiatric:        Behavior: Behavior normal.     ED Results / Procedures / Treatments   Labs (all labs ordered are listed, but only abnormal results are displayed) Labs  Reviewed - No data to display  EKG None  Radiology DG Knee Complete 4 Views Right  Result Date: 01/11/2022 CLINICAL DATA:  Right knee pain after fall 2 days ago. EXAM: RIGHT KNEE - COMPLETE 4+ VIEW COMPARISON:  None Available. FINDINGS: Mild suprapatellar joint effusion is noted. 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 Osteophyte formation is noted. No definite fracture or dislocation is noted. IMPRESSION: Mild degenerative changes as described above. Mild suprapatellar joint effusion. No fracture or dislocation. Electronically Signed   By: Marijo Conception M.D.   On: 01/11/2022 09:33    Procedures Procedures    Medications Ordered in ED Medications - No data to display  ED Course/ Medical Decision Making/ A&P                           Medical Decision Making Amount and/or Complexity of Data Reviewed Radiology: ordered.   66 yo F with  a chief complaint of right knee pain.  This is after a nonsyncopal fall.  She does have signs that she could have an MCL injury on exam.  Plain film independently interpreted by me without fracture or dislocation.  Will place in a knee immobilizer.  Crutches.  Sports medicine follow-up.  10:22 AM:  I have discussed the diagnosis/risks/treatment options with the patient.  Evaluation and diagnostic testing in the emergency department does not suggest an emergent condition requiring admission or immediate intervention beyond what has been performed at this time.  They will follow up with PCP. We also discussed returning to the ED immediately if new or worsening sx occur. We discussed the sx which are most concerning (e.g., sudden worsening pain, fever, inability to tolerate by mouth) that necessitate immediate return. Medications administered to the patient during their visit and any new prescriptions provided to the patient are listed below.  Medications given during this visit Medications - No data to display   The patient appears reasonably screen and/or stabilized for discharge and I doubt any other medical condition or other [facility] requiring further screening, evaluation, or treatment in the ED at this time prior to discharge.          Final Clinical Impression(s) / ED Diagnoses Final diagnoses:  Acute pain of right knee    Rx / DC Orders ED Discharge Orders     None         Deno Etienne, DO 01/11/22 1022

## 2022-01-11 NOTE — Discharge Instructions (Signed)
Take 4 over the counter ibuprofen tablets 3 times a day or 2 over-the-counter naproxen tablets twice a day for pain. Also take tylenol 1000mg (2 extra strength) four times a day.   Follow-up with your doctor in the office.  If you are having ongoing pain they may consider further imaging or referral.

## 2022-04-12 ENCOUNTER — Encounter (HOSPITAL_BASED_OUTPATIENT_CLINIC_OR_DEPARTMENT_OTHER): Payer: Self-pay | Admitting: *Deleted

## 2022-04-12 ENCOUNTER — Emergency Department (HOSPITAL_BASED_OUTPATIENT_CLINIC_OR_DEPARTMENT_OTHER): Payer: Medicare Other

## 2022-04-12 ENCOUNTER — Other Ambulatory Visit: Payer: Self-pay

## 2022-04-12 ENCOUNTER — Emergency Department (HOSPITAL_BASED_OUTPATIENT_CLINIC_OR_DEPARTMENT_OTHER)
Admission: EM | Admit: 2022-04-12 | Discharge: 2022-04-12 | Disposition: A | Discharge status: Home / Self Care | Payer: Medicare Other | Attending: Emergency Medicine | Admitting: Emergency Medicine

## 2022-04-12 DIAGNOSIS — S39012A Strain of muscle, fascia and tendon of lower back, initial encounter: Secondary | ICD-10-CM | POA: Insufficient documentation

## 2022-04-12 DIAGNOSIS — Z79899 Other long term (current) drug therapy: Secondary | ICD-10-CM | POA: Insufficient documentation

## 2022-04-12 DIAGNOSIS — I10 Essential (primary) hypertension: Secondary | ICD-10-CM | POA: Insufficient documentation

## 2022-04-12 DIAGNOSIS — X58XXXA Exposure to other specified factors, initial encounter: Secondary | ICD-10-CM | POA: Diagnosis not present

## 2022-04-12 DIAGNOSIS — S3992XA Unspecified injury of lower back, initial encounter: Secondary | ICD-10-CM | POA: Diagnosis present

## 2022-04-12 DIAGNOSIS — M1991 Primary osteoarthritis, unspecified site: Secondary | ICD-10-CM | POA: Insufficient documentation

## 2022-04-12 DIAGNOSIS — M479 Spondylosis, unspecified: Secondary | ICD-10-CM

## 2022-04-12 MED ORDER — IBUPROFEN 600 MG PO TABS: 600.0000 mg | ORAL_TABLET | Freq: Three times a day (TID) | ORAL | 0 refills | Status: AC | PRN

## 2022-04-12 MED ORDER — IBUPROFEN 600 MG PO TABS: 600.0000 mg | ORAL_TABLET | Freq: Three times a day (TID) | ORAL | 0 refills | Status: DC | PRN

## 2022-04-12 NOTE — ED Provider Notes (Signed)
[facility] Provider Note   CSN: [id number] Arrival date & time: 04/12/22  1019     History  Chief Complaint  Patient presents with   Back Pain    Wendy Randolph is a 67 y.o. female with PMH HTN and  HLD presenting to the ED for lower back pain for the last 2 weeks.  Patient describes pain as like a muscle spasm to lateral aspects of both sides of lower back.  She denies any fall, direct trauma, or other injury to the back or recent increased activity.  She saw her PCP at the onset of pain and was given 6-day course of prednisone as well as Flexeril 3 times daily.  She completed the prednisone but states that this did not help her pain.  She states that she never took the Flexeril and has not taken any other medication including Tylenol or ibuprofen to help her pain.  She states that the pain is worse with twisting of the trunk.  She denies numbness, tingling, weakness, fever, chills, abdominal pain, nausea, vomiting, dysuria, hematuria, bowel or bladder dysfunction, history of cancer, IV drug use, or any other symptoms at this time.    Home Medications Prior to Admission medications   Medication Sig Start Date End Date Taking? Authorizing Provider  ibuprofen (ADVIL) 600 MG tablet Take 1 tablet (600 mg total) by mouth every 8 (eight) hours as needed for up to 5 days for mild pain or moderate pain. 04/12/22 04/17/22 Yes Felicidad Sugarman L, PA-C  amoxicillin-clavulanate (AUGMENTIN) 875-125 MG tablet Take 1 tablet by mouth 2 (two) times daily. 04/19/16   Meuth, Brooke A, PA-C  losartan (COZAAR) 50 MG tablet Take 50 mg by mouth daily.  09/26/15 09/25/16  [provider]  naphazoline-glycerin (CLEAR EYES) 0.012-0.2 % SOLN Place 1-2 drops into both eyes 4 (four) times daily as needed for irritation.    [provider]  oxyCODONE-acetaminophen (PERCOCET/ROXICET) 5-325 MG tablet Take 1-2 tablets by mouth every 8 (eight) hours as needed for  moderate pain. 04/19/16   Meuth, Brooke A, PA-C  pravastatin (PRAVACHOL) 80 MG tablet Take 80 mg by mouth daily.  09/29/15 09/28/16  [provider]      Allergies    Patient has no known allergies.    Review of Systems   Review of Systems  Constitutional:  Negative for activity change, appetite change, chills, diaphoresis and fever.  HENT:  Negative for ear pain and sore throat.   Eyes:  Negative for pain and visual disturbance.  Respiratory:  Negative for cough and shortness of breath.   Cardiovascular:  Negative for chest pain, palpitations and leg swelling.  Gastrointestinal:  Negative for abdominal pain, constipation, diarrhea, nausea and vomiting.  Genitourinary:  Negative for dysuria and hematuria.  Musculoskeletal:  Positive for back pain. Negative for arthralgias, joint swelling, neck pain and neck stiffness.  Skin:  Negative for color change and rash.  Neurological:  Negative for dizziness, seizures, syncope, facial asymmetry, weakness, light-headedness, numbness and headaches.  Psychiatric/Behavioral:  Negative for confusion.   All other systems reviewed and are negative.   Physical Exam Updated Vital Signs BP (!) 188/81 (BP Location: Right Arm)   Pulse 72   Temp 98.3 F (36.8 C) (Oral)   Resp 19   Ht 5\' 6"  (1.676 m)   Wt 60.3 kg   SpO2 100%   BMI 21.47 kg/m  Physical Exam Vitals and nursing note reviewed.  Constitutional:  General: She is not in acute distress.    Appearance: She is well-developed. She is not ill-appearing or toxic-appearing.  HENT:     Head: Normocephalic and atraumatic.  Eyes:     Conjunctiva/sclera: Conjunctivae normal.  Cardiovascular:     Rate and Rhythm: Normal rate and regular rhythm.     Heart sounds: No murmur heard. Pulmonary:     Effort: Pulmonary effort is normal. No respiratory distress.     Breath sounds: Normal breath sounds. No wheezing, rhonchi or rales.  Chest:     Chest wall: No tenderness.  Abdominal:      General: Abdomen is flat.     Palpations: Abdomen is soft.     Tenderness: There is no abdominal tenderness. There is no guarding or rebound.  Musculoskeletal:        General: No deformity. Normal range of motion.     Cervical back: Normal range of motion and neck supple. No rigidity or tenderness.     Right lower leg: No edema.     Left lower leg: No edema.     Comments: No midline CTL spine tenderness, no stepoffs or other deformities, range of motion intact, able to changes positions with ease, steady gait, no tenderness over paraspinal muscles, no tenderness over hips bilaterally, negative straight leg raise  Skin:    General: Skin is warm and dry.     Capillary Refill: Capillary refill takes less than 2 seconds.  Neurological:     General: No focal deficit present.     Mental Status: She is alert and oriented to person, place, and time.     Cranial Nerves: No cranial nerve deficit.     Sensory: No sensory deficit.     Motor: No weakness.     Gait: Gait normal.  Psychiatric:        Mood and Affect: Mood normal.        Behavior: Behavior normal.     ED Results / Procedures / Treatments   Labs (all labs ordered are listed, but only abnormal results are displayed) Labs Reviewed - No data to display  EKG None  Radiology DG Lumbar Spine Complete  Result Date: 04/12/2022 CLINICAL DATA:  Low back pain over the last 2 weeks EXAM: LUMBAR SPINE - COMPLETE 4+ VIEW COMPARISON:  None Available. FINDINGS: No malalignment. No regional fracture. No disc space narrowing. Ordinary endplate osteophytes in the lower lumbar spine. No significant facet arthropathy. No pars defect. Aortic atherosclerosis incidentally noted. 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 IMPRESSION: No acute or traumatic finding. Ordinary endplate osteophytes in the lower lumbar spine. Some left SI joint arthritis. Electronically Signed   By: Nelson Chimes M.D.   On: 04/12/2022 11:12     Procedures Procedures    Medications Ordered in ED Medications - No data to display  ED Course/ Medical Decision Making/ A&P                             Medical Decision Making Amount and/or Complexity of Data Reviewed Radiology: ordered. Decision-making details documented in ED Course.  Risk Prescription drug management.   67 year old female presenting to ED for evaluation of non-traumatic lower back pain.The emergent differential diagnosis for back pain includes but is not limited to fracture, muscle strain, cauda equina, spinal stenosis. DDD, ankylosing spondylitis, acute ligamentous injury, disk herniation, spondylolisthesis, epidural compression syndrome, metastatic cancer, transverse myelitis, vertebral osteomyelitis,  diskitis, kidney stone, pyelonephritis, AAA, Perforated ulcer, Retrocecal appendicitis, pancreatitis, bowel obstruction, retroperitoneal hemorrhage or mass, meningitis. Pt has no red flag symptoms for back pain, is neurologically intact, has benign abdominal exam, she is hypertensive but otherwise vital signs unremarkable, and she has no other associated tenderness or injury to back. Presentation most consistent with muscle strain of lower back likely complicated by arthritic changes in her age group. Previously evaluated by PCP as well though did not take muscle relaxers given at that time. X-ray without any acute bony changes, pt remains neurologically intact. Discussed plan to discharge home and she will take Flexeril previously prescribed as well as ibuprofen/Tylenol and use of a heating pad for better pain management. Also provided with back exercises to help strengthen muscles in back and prevent re-injury. Strictly instructed to follow up with PCP in 1 week for re-evaluation and aware they may choose to do further imaging, refer to physical therapy, or otherwise treat back pain if continued. Given strict ED return precautions. All questions answered and stable for  discharge.          Final Clinical Impression(s) / ED Diagnoses Final diagnoses:  Strain of lumbar region, initial encounter  Osteoarthritis of back    Rx / DC Orders ED Discharge Orders          Ordered    ibuprofen (ADVIL) 600 MG tablet  Every 8 hours PRN        04/12/22 1121              Tonette Lederer, PA-C 04/12/22 1139    Alvira Monday, MD 04/13/22 2147

## 2022-04-12 NOTE — ED Triage Notes (Signed)
Here for evaluation of back pain, mid lower pain, onset approx 2 weeks ago, lying down has relief, moving at times, getting out of bed, will cause increase in spasm like pain. Denies any numbness or tingling in lower extremities. Has not taken any pain meds.

## 2022-04-12 NOTE — Discharge Instructions (Addendum)
Thank you for letting us take care of you today.   Your x-ray showed some arthritis but no broken or out of place bones.  Please take the Flexeril previously prescribed by your primary care provider. I recommend taking it 3 times a day for the next 2-3 days until you start noticing improvement in your symptoms and then as needed. I also recommend you take ibuprofen 600mg  3 times a day for the next 2-3 days to help manage your pain and after that as needed unless otherwise instructed by your doctor. You may take Tylenol and/or use a heating pad as well for any breakthrough pain.   I provided some exercises that may help strengthen your back, relieve your pain, and help prevent you from re-injuring it in the future. Please do these as tolerated.  Follow up with your primary care provider in 1 week to discuss your visit today or sooner for any worsening symptoms.  If you develop worsening, severe pain in your back, weakness in your legs, start using the bathroom on yourself, fever, uncontrollable vomiting, blood in your urine, or other concerns, please be re-evaluated at nearest emergency department.

## 2022-05-09 ENCOUNTER — Other Ambulatory Visit: Payer: Self-pay

## 2022-05-09 ENCOUNTER — Inpatient Hospital Stay (HOSPITAL_BASED_OUTPATIENT_CLINIC_OR_DEPARTMENT_OTHER)
Admission: EM | Admit: 2022-05-09 | Discharge: 2022-05-20 | DRG: 673 | Disposition: A | Discharge status: Home Health | Payer: Medicare Other | Attending: Family Medicine | Admitting: Family Medicine

## 2022-05-09 ENCOUNTER — Emergency Department (HOSPITAL_BASED_OUTPATIENT_CLINIC_OR_DEPARTMENT_OTHER): Payer: Medicare Other

## 2022-05-09 ENCOUNTER — Encounter (HOSPITAL_BASED_OUTPATIENT_CLINIC_OR_DEPARTMENT_OTHER): Payer: Self-pay | Admitting: Emergency Medicine

## 2022-05-09 DIAGNOSIS — N179 Acute kidney failure, unspecified: Secondary | ICD-10-CM | POA: Diagnosis present

## 2022-05-09 DIAGNOSIS — R197 Diarrhea, unspecified: Secondary | ICD-10-CM | POA: Insufficient documentation

## 2022-05-09 DIAGNOSIS — R768 Other specified abnormal immunological findings in serum: Secondary | ICD-10-CM | POA: Diagnosis present

## 2022-05-09 DIAGNOSIS — E872 Acidosis, unspecified: Secondary | ICD-10-CM | POA: Diagnosis present

## 2022-05-09 DIAGNOSIS — F17210 Nicotine dependence, cigarettes, uncomplicated: Secondary | ICD-10-CM | POA: Diagnosis present

## 2022-05-09 DIAGNOSIS — B349 Viral infection, unspecified: Secondary | ICD-10-CM | POA: Diagnosis present

## 2022-05-09 DIAGNOSIS — K219 Gastro-esophageal reflux disease without esophagitis: Secondary | ICD-10-CM | POA: Diagnosis present

## 2022-05-09 DIAGNOSIS — M549 Dorsalgia, unspecified: Secondary | ICD-10-CM | POA: Diagnosis present

## 2022-05-09 DIAGNOSIS — D509 Iron deficiency anemia, unspecified: Secondary | ICD-10-CM | POA: Diagnosis present

## 2022-05-09 DIAGNOSIS — D696 Thrombocytopenia, unspecified: Secondary | ICD-10-CM | POA: Diagnosis not present

## 2022-05-09 DIAGNOSIS — Z8711 Personal history of peptic ulcer disease: Secondary | ICD-10-CM | POA: Diagnosis not present

## 2022-05-09 DIAGNOSIS — K922 Gastrointestinal hemorrhage, unspecified: Secondary | ICD-10-CM | POA: Diagnosis not present

## 2022-05-09 DIAGNOSIS — E782 Mixed hyperlipidemia: Secondary | ICD-10-CM | POA: Diagnosis present

## 2022-05-09 DIAGNOSIS — K828 Other specified diseases of gallbladder: Secondary | ICD-10-CM | POA: Diagnosis present

## 2022-05-09 DIAGNOSIS — Y92009 Unspecified place in unspecified non-institutional (private) residence as the place of occurrence of the external cause: Secondary | ICD-10-CM | POA: Diagnosis not present

## 2022-05-09 DIAGNOSIS — K649 Unspecified hemorrhoids: Secondary | ICD-10-CM | POA: Diagnosis present

## 2022-05-09 DIAGNOSIS — K838 Other specified diseases of biliary tract: Secondary | ICD-10-CM | POA: Diagnosis present

## 2022-05-09 DIAGNOSIS — D72825 Bandemia: Secondary | ICD-10-CM | POA: Diagnosis present

## 2022-05-09 DIAGNOSIS — R7989 Other specified abnormal findings of blood chemistry: Secondary | ICD-10-CM | POA: Diagnosis not present

## 2022-05-09 DIAGNOSIS — R748 Abnormal levels of other serum enzymes: Secondary | ICD-10-CM | POA: Diagnosis not present

## 2022-05-09 DIAGNOSIS — K625 Hemorrhage of anus and rectum: Secondary | ICD-10-CM

## 2022-05-09 DIAGNOSIS — E871 Hypo-osmolality and hyponatremia: Secondary | ICD-10-CM | POA: Diagnosis present

## 2022-05-09 DIAGNOSIS — Z79899 Other long term (current) drug therapy: Secondary | ICD-10-CM | POA: Diagnosis not present

## 2022-05-09 DIAGNOSIS — A09 Infectious gastroenteritis and colitis, unspecified: Secondary | ICD-10-CM | POA: Diagnosis not present

## 2022-05-09 DIAGNOSIS — E860 Dehydration: Secondary | ICD-10-CM | POA: Diagnosis present

## 2022-05-09 DIAGNOSIS — N19 Unspecified kidney failure: Secondary | ICD-10-CM | POA: Diagnosis not present

## 2022-05-09 DIAGNOSIS — K7200 Acute and subacute hepatic failure without coma: Secondary | ICD-10-CM | POA: Diagnosis present

## 2022-05-09 DIAGNOSIS — I10 Essential (primary) hypertension: Secondary | ICD-10-CM | POA: Diagnosis present

## 2022-05-09 DIAGNOSIS — E8729 Other acidosis: Secondary | ICD-10-CM | POA: Diagnosis not present

## 2022-05-09 DIAGNOSIS — N281 Cyst of kidney, acquired: Secondary | ICD-10-CM | POA: Diagnosis present

## 2022-05-09 DIAGNOSIS — Z992 Dependence on renal dialysis: Secondary | ICD-10-CM | POA: Diagnosis not present

## 2022-05-09 DIAGNOSIS — N170 Acute kidney failure with tubular necrosis: Secondary | ICD-10-CM | POA: Diagnosis present

## 2022-05-09 DIAGNOSIS — T39395A Adverse effect of other nonsteroidal anti-inflammatory drugs [NSAID], initial encounter: Secondary | ICD-10-CM | POA: Diagnosis present

## 2022-05-09 DIAGNOSIS — R7303 Prediabetes: Secondary | ICD-10-CM | POA: Diagnosis present

## 2022-05-09 DIAGNOSIS — D649 Anemia, unspecified: Secondary | ICD-10-CM | POA: Diagnosis not present

## 2022-05-09 DIAGNOSIS — E876 Hypokalemia: Secondary | ICD-10-CM | POA: Diagnosis present

## 2022-05-09 DIAGNOSIS — Z90710 Acquired absence of both cervix and uterus: Secondary | ICD-10-CM

## 2022-05-09 LAB — COMPREHENSIVE METABOLIC PANEL
ALT: 913 U/L — ABNORMAL HIGH (ref 0–44)
AST: 608 U/L — ABNORMAL HIGH (ref 15–41)
Albumin: 3 g/dL — ABNORMAL LOW (ref 3.5–5.0)
Alkaline Phosphatase: 770 U/L — ABNORMAL HIGH (ref 38–126)
Anion gap: 18 — ABNORMAL HIGH (ref 5–15)
BUN: 104 mg/dL — ABNORMAL HIGH (ref 8–23)
CO2: 15 mmol/L — ABNORMAL LOW (ref 22–32)
Calcium: 8.9 mg/dL (ref 8.9–10.3)
Chloride: 99 mmol/L (ref 98–111)
Creatinine, Ser: 13.89 mg/dL — ABNORMAL HIGH (ref 0.44–1.00)
GFR, Estimated: 3 mL/min — ABNORMAL LOW (ref >60)
Glucose, Bld: 103 mg/dL — ABNORMAL HIGH (ref 70–99)
Potassium: 4.2 mmol/L (ref 3.5–5.1)
Sodium: 132 mmol/L — ABNORMAL LOW (ref 135–145)
Total Bilirubin: 20 mg/dL (ref 0.3–1.2)
Total Protein: 7.1 g/dL (ref 6.5–8.1)

## 2022-05-09 LAB — BILIRUBIN, DIRECT: Bilirubin, Direct: 16.2 mg/dL — ABNORMAL HIGH (ref 0.0–0.2)

## 2022-05-09 LAB — LIPASE, BLOOD: Lipase: 1118 U/L — ABNORMAL HIGH (ref 11–51)

## 2022-05-09 LAB — MRSA NEXT GEN BY PCR, NASAL: MRSA by PCR Next Gen: NOT DETECTED

## 2022-05-09 MED ORDER — SODIUM CHLORIDE 0.9 % IV SOLN: INTRAVENOUS | Status: DC

## 2022-05-09 MED ORDER — ONDANSETRON HCL 4 MG PO TABS: 4.0000 mg | ORAL_TABLET | Freq: Four times a day (QID) | ORAL | Status: DC | PRN

## 2022-05-09 MED ORDER — ONDANSETRON HCL 4 MG/2ML IJ SOLN
4.0000 mg | Freq: Once | INTRAMUSCULAR | Status: AC
  Administered 2022-05-09: 4 mg via INTRAVENOUS
  Filled 2022-05-09: qty 2

## 2022-05-09 MED ORDER — LABETALOL HCL 5 MG/ML IV SOLN
10.0000 mg | INTRAVENOUS | Status: DC | PRN
  Administered 2022-05-18: 10 mg via INTRAVENOUS
  Filled 2022-05-09: qty 4

## 2022-05-09 MED ORDER — SODIUM CHLORIDE 0.9 % IV BOLUS
2000.0000 mL | Freq: Once | INTRAVENOUS | Status: AC
  Administered 2022-05-09: 2000 mL via INTRAVENOUS

## 2022-05-09 MED ORDER — ONDANSETRON HCL 4 MG/2ML IJ SOLN: 4.0000 mg | Freq: Four times a day (QID) | INTRAMUSCULAR | Status: DC | PRN

## 2022-05-09 MED ORDER — SODIUM CHLORIDE 0.9 % IV BOLUS
1000.0000 mL | Freq: Once | INTRAVENOUS | Status: AC
  Administered 2022-05-09: 1000 mL via INTRAVENOUS

## 2022-05-09 MED ORDER — HEPARIN SODIUM (PORCINE) 5000 UNIT/ML IJ SOLN
5000.0000 [IU] | Freq: Three times a day (TID) | INTRAMUSCULAR | Status: DC
  Administered 2022-05-09 – 2022-05-10 (×2): 5000 [IU] via SUBCUTANEOUS
  Filled 2022-05-09 (×2): qty 1

## 2022-05-09 MED ORDER — SODIUM BICARBONATE 650 MG PO TABS
650.0000 mg | ORAL_TABLET | Freq: Two times a day (BID) | ORAL | Status: DC
  Administered 2022-05-09: 650 mg via ORAL
  Filled 2022-05-09: qty 1

## 2022-05-09 MED ORDER — NAPHAZOLINE-GLYCERIN 0.012-0.25 % OP SOLN: 1.0000 [drp] | Freq: Four times a day (QID) | OPHTHALMIC | Status: DC | PRN

## 2022-05-09 NOTE — H&P (Signed)
History and Physical    Wendy Randolph [id number] DOB: May 30, 1955 DOA: 05/09/2022  DOS: the patient was seen and examined on 05/09/2022  PCP: Patient, No Pcp Per   Patient coming from: Home  I have personally briefly reviewed patient's old medical records in [place]  CC: N/V/D HPI: 67 year old African-American female history of hypertension, hyperlipidemia presents to the ER today with a 10-day history of persistent nausea, vomiting, diarrhea.  She states that on Wednesday morning February 7, she woke up and had a normal breakfast.  She states that as soon as she started eating, she felt the urge to vomit.  She vomited then immediately had diarrhea.  She denies any illnesses at home.  She lives with her daughter.  She denies any takeout food or eating out in restaurants recently.  Denies any fever.  She states that over the next 10 days, she has had persistent vomiting every time she eats or drinks anything.  This is immediately followed by diarrhea after she tries to swallow.  She states that even after she vomits up what she has recently drank or eaten, she will have immediate diarrhea.  She has not been able to take her medicines since she started vomiting.  She finally presented to the ER today due to persistent vomiting and diarrhea.  On arrival temp 97.6 heart rate 89 blood pressure 138/77.  Labs showed a white count of 14.5, hemoglobin 0.1, platelets of 211  Sodium 132, potassium 4.2, bicarb 15, BUN of 104, creatinine 13.89  AST of 608, ALT of 913, alk phos of 770, total bili of 20, direct bilirubin of 16.2, lipase of 1118.  CT abdomen pelvis without contrast was negative for any pancreatic inflammatory changes or masses.  There is no hydroureteronephrosis.  Urinary bladder was decompressed.  Right upper quadrant ultrasound demonstrated gallbladder sludge without sonographic evidence of acute cholecystitis.  Common bile duct was 0.5 cm.  Gallbladder wall was not  thickened.  Patient given 2 L of normal saline.  She has not urinated yet.  Patient transferred to [facility].  EDP has contacted Dr. Fuller Plan with GI and Dr. Jonnie Finner with nephrology.   ED Course: Scr 13.9, AST 608, ALT 913, t. Bili 20, direct bili 16.2  Review of Systems:  Review of Systems  Constitutional:  Positive for weight loss. Negative for chills and fever.  HENT: Negative.    Eyes: Negative.   Respiratory: Negative.    Cardiovascular: Negative.   Gastrointestinal:  Positive for diarrhea, nausea and vomiting. Negative for abdominal pain.  Genitourinary: Negative.   Musculoskeletal: Negative.   Skin: Negative.   Neurological:  Positive for weakness.  Endo/Heme/Allergies: Negative.   Psychiatric/Behavioral: Negative.    All other systems reviewed and are negative.   Past Medical History:  Diagnosis Date   High cholesterol    Hypertension    Perirectal abscess 04/17/2016    Past Surgical History:  Procedure Laterality Date   ABDOMINAL HYSTERECTOMY     INCISION AND DRAINAGE PERIRECTAL ABSCESS N/A 04/18/2016   Procedure: IRRIGATION AND DEBRIDEMENT PERIRECTAL ABSCESS;  Surgeon: Excell Seltzer, MD;  Location: [facility];  Service: General;  Laterality: N/A;     reports that she has been smoking cigarettes. She has been smoking an average of .25 packs per day. She has never used smokeless tobacco. She reports current alcohol use of about 3.0 standard drinks of alcohol per week. She reports that she does not currently use drugs.  No Known Allergies  History reviewed. No  pertinent family history.  Prior to Admission medications   Medication Sig Start Date End Date Taking? Authorizing Provider  amoxicillin-clavulanate (AUGMENTIN) 875-125 MG tablet Take 1 tablet by mouth 2 (two) times daily. 04/19/16   Meuth, Brooke A, PA-C  losartan (COZAAR) 50 MG tablet Take 50 mg by mouth daily.  09/26/15 09/25/16  [provider]  naphazoline-glycerin (CLEAR EYES) 0.012-0.2 % SOLN  Place 1-2 drops into both eyes 4 (four) times daily as needed for irritation.    [provider]  oxyCODONE-acetaminophen (PERCOCET/ROXICET) 5-325 MG tablet Take 1-2 tablets by mouth every 8 (eight) hours as needed for moderate pain. 04/19/16   Meuth, Brooke A, PA-C  pravastatin (PRAVACHOL) 80 MG tablet Take 80 mg by mouth daily.  09/29/15 09/28/16  [provider]    Physical Exam: Vitals:   05/09/22 1830 05/09/22 1845 05/09/22 1900 05/09/22 2006  BP: 138/83 (!) 155/68 (!) 165/68 (!) 146/64  Pulse:   85 90  Resp: (!) 24 16 14 14  $ Temp:    97.6 F (36.4 C)  TempSrc:    Oral  SpO2: 95%  97%   Weight:    60.9 kg  Height:    5' 6"$  (1.676 m)    Physical Exam Constitutional:      General: She is not in acute distress.    Appearance: Normal appearance. She is not toxic-appearing.  HENT:     Head: Normocephalic and atraumatic.     Nose: Nose normal.  Eyes:     General: Scleral icterus present.  Cardiovascular:     Rate and Rhythm: Normal rate and regular rhythm.     Pulses: Normal pulses.  Pulmonary:     Effort: Pulmonary effort is normal.     Breath sounds: Normal breath sounds.  Abdominal:     General: Bowel sounds are normal. There is no distension.     Palpations: Abdomen is soft. There is hepatomegaly.     Tenderness: There is abdominal tenderness in the right upper quadrant. There is no guarding or rebound.     Comments: Liver edge 5 cm below right costal margin  Musculoskeletal:     Right lower leg: No edema.     Left lower leg: No edema.  Skin:    General: Skin is warm and dry.     Capillary Refill: Capillary refill takes less than 2 seconds.  Neurological:     General: No focal deficit present.     Mental Status: She is alert and oriented to person, place, and time.      Labs on Admission: I have personally reviewed following labs and imaging studies  CBC: Recent Labs  Lab 05/09/22 1334  WBC 14.5*  NEUTROABS 10.3*  HGB 11.1*  HCT 32.2*   MCV 89.0  PLT 123456   Basic Metabolic Panel: Recent Labs  Lab 05/09/22 1334  NA 132*  K 4.2  CL 99  CO2 15*  GLUCOSE 103*  BUN 104*  CREATININE 13.89*  CALCIUM 8.9   GFR: Estimated Creatinine Clearance: 3.7 mL/min (A) (by C-G formula based on SCr of 13.89 mg/dL (H)). Liver Function Tests: Recent Labs  Lab 05/09/22 1334  AST 608*  ALT 913*  ALKPHOS 770*  BILITOT 20.0*  PROT 7.1  ALBUMIN 3.0*   Recent Labs  Lab 05/09/22 1334  LIPASE 1,118*   No results for input(s): "AMMONIA" in the last 168 hours. Coagulation Profile: No results for input(s): "INR", "PROTIME" in the last 168 hours. Cardiac Enzymes: No results  for input(s): "CKTOTAL", "CKMB", "CKMBINDEX", "TROPONINI", "TROPONINIHS" in the last 168 hours. BNP (last 3 results) No results for input(s): "PROBNP" in the last 8760 hours. HbA1C: No results for input(s): "HGBA1C" in the last 72 hours. CBG: No results for input(s): "GLUCAP" in the last 168 hours. Lipid Profile: No results for input(s): "CHOL", "HDL", "LDLCALC", "TRIG", "CHOLHDL", "LDLDIRECT" in the last 72 hours. Thyroid Function Tests: No results for input(s): "TSH", "T4TOTAL", "FREET4", "T3FREE", "THYROIDAB" in the last 72 hours. Anemia Panel: No results for input(s): "VITAMINB12", "FOLATE", "FERRITIN", "TIBC", "IRON", "RETICCTPCT" in the last 72 hours. Urine analysis:    Component Value Date/Time   COLORURINE AMBER (A) 04/17/2016 1830   APPEARANCEUR CLOUDY (A) 04/17/2016 1830   LABSPEC 1.023 04/17/2016 1830   PHURINE 6.0 04/17/2016 1830   GLUCOSEU NEGATIVE 04/17/2016 1830   HGBUR NEGATIVE 04/17/2016 1830   BILIRUBINUR SMALL (A) 04/17/2016 1830   [place] 04/17/2016 1830   PROTEINUR 30 (A) 04/17/2016 1830   NITRITE NEGATIVE 04/17/2016 1830   LEUKOCYTESUR NEGATIVE 04/17/2016 1830    Radiological Exams on Admission: I have personally reviewed images US Abdomen Limited RUQ (LIVER/GB)  Result Date: 05/09/2022 CLINICAL DATA:  Right  upper quadrant pain, jaundice EXAM: ULTRASOUND ABDOMEN LIMITED RIGHT UPPER QUADRANT COMPARISON:  Same-day CT FINDINGS: Gallbladder: Sludge in the gallbladder. No discrete gallstones. No gallbladder wall thickening. No sonographic Murphy sign noted by sonographer. Common bile duct: Diameter: 0.5 cm Liver: No focal lesion identified. Within normal limits in parenchymal echogenicity. Portal vein is patent on color Doppler imaging with normal direction of blood flow towards the liver. Other: None. IMPRESSION: Gallbladder sludge without ultrasound evidence of acute cholecystitis. Consider nuclear scintigraphic HIDA scan to further evaluate if there is clinical concern for cholecystitis or biliary ductal obstruction. Electronically Signed   By: Delanna Ahmadi M.D.   On: 05/09/2022 17:52   CT ABDOMEN PELVIS WO CONTRAST  Result Date: 05/09/2022 CLINICAL DATA:  67 year old female with history of acute onset of abdominal pain. Left renal failure. Vomiting and diarrhea. EXAM: CT ABDOMEN AND PELVIS WITHOUT CONTRAST TECHNIQUE: Multidetector CT imaging of the abdomen and pelvis was performed following the standard protocol without IV contrast. RADIATION DOSE REDUCTION: This exam was performed according to the departmental dose-optimization program which includes automated exposure control, adjustment of the mA and/or kV according to patient size and/or use of iterative reconstruction technique. COMPARISON:  CTA of the abdomen and pelvis 04/17/2016. FINDINGS: Lower chest: Atherosclerosis in the descending thoracic aorta as well as the right coronary artery. Hepatobiliary: No definite suspicious cystic or solid hepatic lesions are confidently identified on today's noncontrast CT examination. There is a small amount of amorphous intermediate attenuation material lying dependently in the gallbladder, likely to represent biliary sludge. Gallbladder is moderately distended. Gallbladder wall does not appear thickened. No  pericholecystic fluid or surrounding inflammatory changes. Pancreas: No definite pancreatic mass or peripancreatic fluid collections or inflammatory changes are noted on today's noncontrast CT examination. Spleen: Unremarkable. Adrenals/Urinary Tract: In the anterior aspect of the lower pole the left kidney there is a 1.5 cm low-attenuation lesion, incompletely characterized on today's noncontrast CT examination, but statistically likely to represent cysts (no imaging follow-up recommended). Right kidney and bilateral adrenal glands are otherwise normal in appearance. No hydroureteronephrosis. Urinary bladder is nearly completely decompressed, but otherwise unremarkable in appearance. Stomach/Bowel: The unenhanced appearance of the stomach is normal. No pathologic dilatation of small bowel or colon. Numerous colonic diverticuli are noted, without surrounding inflammatory changes to indicate an acute diverticulitis at this  time. Normal appendix. Vascular/Lymphatic: Atherosclerotic calcifications are noted in the abdominal aorta. No lymphadenopathy noted in the abdomen or pelvis. Reproductive: Status post hysterectomy. Ovaries are not confidently identified may be surgically absent or atrophic. Other: No significant volume of ascites.  No pneumoperitoneum. Musculoskeletal: There are no aggressive appearing lytic or blastic lesions noted in the visualized portions of the skeleton. IMPRESSION: 1. No acute findings are noted in the abdomen or pelvis to account for the patient's symptoms. 2. Small amount of biliary sludge lying dependently in the gallbladder. No imaging findings to suggest an acute cholecystitis at this time. 3. Colonic diverticulosis without evidence of acute diverticulitis at this time. 4. Aortic atherosclerosis. 5. Additional incidental findings, as above. Electronically Signed   By: Vinnie Langton M.D.   On: 05/09/2022 16:43    EKG: My personal interpretation of EKG shows:  NSR    Assessment/Plan Principal Problem:   Acute renal failure (HCC) Active Problems:   Elevated LFTs   Hyperbilirubinemia   Essential hypertension   Mixed hyperlipidemia   Diarrhea    Assessment and Plan: * Acute renal failure ([place]) Admit to telemetry bed. Nephrology consultation. Continue with aggressive IVF resuscitation. CT abd/pelvis negative for ureteral obstruction. Still awaiting UA.  Elevated LFTs With her elevated LFTs, direct hyperbilirubinemia and N/V/Diarrhea, concern for acute hepatitis. Awaiting acute hepatitis panel. GI consult pending  Hyperbilirubinemia With her elevated LFTs, direct hyperbilirubinemia and N/V/Diarrhea, concern for acute hepatitis. Awaiting acute hepatitis panel.  Diarrhea With her elevated LFTs, direct hyperbilirubinemia and N/V/Diarrhea, concern for acute hepatitis. Awaiting acute hepatitis panel. Also send C. Diff and viral diarrhea panel.   Mixed hyperlipidemia Given elevated LFTs, will hold on statin  Essential hypertension Hold losartan due to ARF. If BP elevated, can restart low dose norvasc.   DVT prophylaxis: SQ Heparin Code Status: Full Code Family Communication: no family at bedside  Disposition Plan: return home  Consults called: EDP has consulted Dr. Fuller Plan with GI and Dr. Jonnie Finner with nephrology  Admission status: Inpatient, Telemetry bed   Kristopher Oppenheim, DO [facility] 05/09/2022, 9:58 PM

## 2022-05-09 NOTE — ED Notes (Signed)
RN unable to obtain lab work, Darl Householder MD made aware. RN to re-attempt after pt received 1L NS bolus

## 2022-05-09 NOTE — ED Notes (Signed)
ED TO INPATIENT HANDOFF REPORT  ED Nurse Name and Phone #: Alauna Hayden RN  S Name/Age/Gender Wendy Randolph 67 y.o. female Room/Bed: MH12/MH12  Code Status   Code Status: Prior  Home/SNF/Other Home Patient oriented to: self, place, time, and situation Is this baseline? Yes   Triage Complete: Triage complete  Chief Complaint Acute renal failure [facility]) [N17.9]  Triage Note Pt c/o vomiting and diarrhea since Wednesday. Pt with c/o weakness and fatigue. Pt unable to take her regular prescribed medications due to vomiting.   Allergies No Known Allergies  Level of Care/Admitting Diagnosis ED Disposition     ED Disposition  Admit   Condition  --   Comment  Hospital Area: [place] [[id number]]  Level of Care: Progressive [102]  Admit to Progressive based on following criteria: GI, ENDOCRINE disease patients with GI bleeding, acute liver failure or pancreatitis, stable with diabetic ketoacidosis or thyrotoxicosis (hypothyroid) state.  Admit to Progressive based on following criteria: NEPHROLOGY stable condition requiring close monitoring for AKI, requiring Hemodialysis or Peritoneal Dialysis either from expected electrolyte imbalance, acidosis, or fluid overload that can be managed by NIPPV or high flow oxygen.  May admit patient to [facility] or [facility] if equivalent level of care is available:: No  Interfacility transfer: Yes  Covid Evaluation: Asymptomatic - no recent exposure (last 10 days) testing not required  Diagnosis: Acute renal failure [facility]) [id number]  Admitting Physician: Patrecia Pour [phone]  Attending Physician: Patrecia Pour [id number]  Certification:: I certify this patient will need inpatient services for at least 2 midnights  Estimated Length of Stay: 5          B Medical/Surgery History Past Medical History:  Diagnosis Date   High cholesterol    Hypertension    Past Surgical History:  Procedure Laterality Date   ABDOMINAL  HYSTERECTOMY     INCISION AND DRAINAGE PERIRECTAL ABSCESS N/A 04/18/2016   Procedure: IRRIGATION AND [place];  Surgeon: Excell Seltzer, MD;  Location: [facility];  Service: General;  Laterality: N/A;     A IV Location/Drains/Wounds Patient Lines/Drains/Airways Status     Active Line/Drains/Airways     Name Placement date Placement time Site Days   Peripheral IV 05/09/22 22 G Right Antecubital 05/09/22  1537  Antecubital  less than 1            Intake/Output Last 24 hours  Intake/Output Summary (Last 24 hours) at 05/09/2022 1810 Last data filed at 05/09/2022 1710 Gross per 24 hour  Intake 1000.23 ml  Output --  Net 1000.23 ml    Labs/Imaging Results for orders placed or performed during the hospital encounter of 05/09/22 (from the past 48 hour(s))  CBC with Differential     Status: Abnormal   Collection Time: 05/09/22  1:34 PM  Result Value Ref Range   WBC 14.5 (H) 4.0 - 10.5 K/uL    Comment: REPEATED TO VERIFY WHITE COUNT CONFIRMED ON SMEAR    RBC 3.62 (L) 3.87 - 5.11 MIL/uL   Hemoglobin 11.1 (L) 12.0 - 15.0 g/dL   HCT 32.2 (L) 36.0 - 46.0 %   MCV 89.0 80.0 - 100.0 fL   MCH 30.7 26.0 - 34.0 pg   MCHC 34.5 30.0 - 36.0 g/dL   RDW 14.5 11.5 - 15.5 %   Platelets 211 150 - 400 K/uL   nRBC 8.0 (H) 0.0 - 0.2 %    Comment: REPEATED TO VERIFY   Neutrophils Relative % 71 %  Neutro Abs 10.3 (H) 1.7 - 7.7 K/uL   Lymphocytes Relative 11 %   Lymphs Abs 1.7 0.7 - 4.0 K/uL   Monocytes Relative 11 %   Monocytes Absolute 1.6 (H) 0.1 - 1.0 K/uL   Eosinophils Relative 2 %   Eosinophils Absolute 0.2 0.0 - 0.5 K/uL   Basophils Relative 1 %   Basophils Absolute 0.1 0.0 - 0.1 K/uL   WBC Morphology MILD LEFT SHIFT (1-5% METAS, OCC MYELO, OCC BANDS)     Comment: See Note   RBC Morphology MORPHOLOGY UNREMARKABLE    Smear Review Normal platelet morphology    Immature Granulocytes 4 %   Abs Immature Granulocytes 0.52 (H) 0.00 - 0.07 K/uL    Comment: Performed  at [facility], [address] [place], [place] [address]  Lipase, blood     Status: Abnormal   Collection Time: 05/09/22  1:34 PM  Result Value Ref Range   Lipase 1,118 (H) 11 - 51 U/L    Comment: Performed at [facility], [address] [place], [place] [address]  Comprehensive metabolic panel     Status: Abnormal   Collection Time: 05/09/22  1:34 PM  Result Value Ref Range   Sodium 132 (L) 135 - 145 mmol/L   Potassium 4.2 3.5 - 5.1 mmol/L   Chloride 99 98 - 111 mmol/L   CO2 15 (L) 22 - 32 mmol/L   Glucose, Bld 103 (H) 70 - 99 mg/dL    Comment: Glucose reference range applies only to samples taken after fasting for at least 8 hours.   BUN 104 (H) 8 - 23 mg/dL   Creatinine, Ser 13.89 (H) 0.44 - 1.00 mg/dL   Calcium 8.9 8.9 - 10.3 mg/dL   Total Protein 7.1 6.5 - 8.1 g/dL   Albumin 3.0 (L) 3.5 - 5.0 g/dL   AST 608 (H) 15 - 41 U/L   ALT 913 (H) 0 - 44 U/L   Alkaline Phosphatase 770 (H) 38 - 126 U/L   Total Bilirubin 20.0 (HH) 0.3 - 1.2 mg/dL    Comment: CRITICAL RESULT CALLED TO, READ BACK BY AND VERIFIED WITH GOUGE, S, RN AT 1457 ON [id number] BY BOWLBY, J   GFR, Estimated 3 (L) >60 mL/min    Comment: (NOTE) Calculated using the CKD-EPI Creatinine Equation (2021)    Anion gap 18 (H) 5 - 15    Comment: Performed at [facility], [address] [place], [place] [address]   US Abdomen Limited RUQ (LIVER/GB)  Result Date: 05/09/2022 CLINICAL DATA:  Right upper quadrant pain, jaundice EXAM: ULTRASOUND ABDOMEN LIMITED RIGHT UPPER QUADRANT COMPARISON:  Same-day CT FINDINGS: Gallbladder: Sludge in the gallbladder. No discrete gallstones. No gallbladder wall thickening. No sonographic Murphy sign noted by sonographer. Common bile duct: Diameter: 0.5 cm Liver: No focal lesion identified. Within normal limits in parenchymal echogenicity. Portal vein is patent on color Doppler imaging with normal direction of blood flow towards the liver. Other: None.  IMPRESSION: Gallbladder sludge without ultrasound evidence of acute cholecystitis. Consider nuclear scintigraphic HIDA scan to further evaluate if there is clinical concern for cholecystitis or biliary ductal obstruction. Electronically Signed   By: Delanna Ahmadi M.D.   On: 05/09/2022 17:52   CT ABDOMEN PELVIS WO CONTRAST  Result Date: 05/09/2022 CLINICAL DATA:  67 year old female with history of acute onset of abdominal pain. Left renal failure. Vomiting and diarrhea. EXAM: CT ABDOMEN AND PELVIS WITHOUT CONTRAST TECHNIQUE: Multidetector CT imaging  of the abdomen and pelvis was performed following the standard protocol without IV contrast. RADIATION DOSE REDUCTION: This exam was performed according to the departmental dose-optimization program which includes automated exposure control, adjustment of the mA and/or kV according to patient size and/or use of iterative reconstruction technique. COMPARISON:  CTA of the abdomen and pelvis 04/17/2016. FINDINGS: Lower chest: Atherosclerosis in the descending thoracic aorta as well as the right coronary artery. Hepatobiliary: No definite suspicious cystic or solid hepatic lesions are confidently identified on today's noncontrast CT examination. There is a small amount of amorphous intermediate attenuation material lying dependently in the gallbladder, likely to represent biliary sludge. Gallbladder is moderately distended. Gallbladder wall does not appear thickened. No pericholecystic fluid or surrounding inflammatory changes. Pancreas: No definite pancreatic mass or peripancreatic fluid collections or inflammatory changes are noted on today's noncontrast CT examination. Spleen: Unremarkable. Adrenals/Urinary Tract: In the anterior aspect of the lower pole the left kidney there is a 1.5 cm low-attenuation lesion, incompletely characterized on today's noncontrast CT examination, but statistically likely to represent cysts (no imaging follow-up recommended). Right kidney  and bilateral adrenal glands are otherwise normal in appearance. No hydroureteronephrosis. Urinary bladder is nearly completely decompressed, but otherwise unremarkable in appearance. Stomach/Bowel: The unenhanced appearance of the stomach is normal. No pathologic dilatation of small bowel or colon. Numerous colonic diverticuli are noted, without surrounding inflammatory changes to indicate an acute diverticulitis at this time. Normal appendix. Vascular/Lymphatic: Atherosclerotic calcifications are noted in the abdominal aorta. No lymphadenopathy noted in the abdomen or pelvis. Reproductive: Status post hysterectomy. Ovaries are not confidently identified may be surgically absent or atrophic. Other: No significant volume of ascites.  No pneumoperitoneum. Musculoskeletal: There are no aggressive appearing lytic or blastic lesions noted in the visualized portions of the skeleton. IMPRESSION: 1. No acute findings are noted in the abdomen or pelvis to account for the patient's symptoms. 2. Small amount of biliary sludge lying dependently in the gallbladder. No imaging findings to suggest an acute cholecystitis at this time. 3. Colonic diverticulosis without evidence of acute diverticulitis at this time. 4. Aortic atherosclerosis. 5. Additional incidental findings, as above. Electronically Signed   By: Vinnie Langton M.D.   On: 05/09/2022 16:43    Pending Labs Unresulted Labs (From admission, onward)     Start     Ordered   05/09/22 1757  Bilirubin, direct  Once,   STAT        05/09/22 1757   05/09/22 1514  Hepatitis panel, acute  Once,   URGENT        05/09/22 1513   05/09/22 1512  Urinalysis, Routine w reflex microscopic -Urine, Clean Catch  Once,   URGENT       Question:  Specimen Source  Answer:  Urine, Clean Catch   05/09/22 1512            Vitals/Pain Today's Vitals   05/09/22 1645 05/09/22 1659 05/09/22 1700 05/09/22 1715  BP: (!) 140/56  (!) 146/68 (!) 140/61  Pulse:      Resp: 15  17  13  $ Temp:  97.6 F (36.4 C)    TempSrc:  Oral    SpO2:      Weight:      Height:      PainSc:        Isolation Precautions No active isolations  Medications Medications  sodium chloride 0.9 % bolus 1,000 mL ( Intravenous Stopped 05/09/22 1658)  ondansetron (ZOFRAN) injection 4 mg (4 mg Intravenous Given 05/09/22  1541)  sodium chloride 0.9 % bolus 1,000 mL (1,000 mLs Intravenous New Bag/Given 05/09/22 1803)    Mobility walks with person assist     Focused Assessments Cardiac Assessment Handoff:    No results found for: "CKTOTAL", "CKMB", "CKMBINDEX", "TROPONINI" No results found for: "DDIMER" Does the Patient currently have chest pain? No    R Recommendations: See Admitting Provider Note  Report given to:   Additional Notes:

## 2022-05-09 NOTE — ED Notes (Signed)
Called [facility] for transport at 5:56.

## 2022-05-09 NOTE — Assessment & Plan Note (Signed)
Given elevated LFTs, will hold on statin

## 2022-05-09 NOTE — Consult Note (Addendum)
Renal Service Consult Note [facility]  Wendy Randolph 05/09/2022 Sol Blazing, MD Requesting Physician: Dr. Bridgett Larsson  Reason for Consult: Renal failure HPI: The patient is a 67 y.o. year-old w/ PMH as below who presented to ED for gen weakness, fatigue and N/V/ D for the last 10 days. Unable to take her prescription medications. No hx liver/ kidney problems. No sick contacts. In ED BP's were wnl, HR 90-110, RR 13, afebrile. Labs showed BUN 104, creat 13.8 (Cr on Mar 26, 2022 was 0.65). Also Tbili is 20.0 and AST 608, ALT 913, alk phos 770, alb 3.0.  glucose 103. Pt rec'd IV zofran, 2 L bolus NS and will be admitted. We are asked to see for renal failure.   In [facility] pt saw per PCP w/ [facility] on 03/26/22 for HTN on norvasc, GERD, Fe def anemia, mixed HL, pre diabetes, weight loss, acute R back pain and vit D deficiency.   Pt seen in room. Main issues outlined above, she was taking a lot of cold syrups and pills, not sure about nsaids.  Had dizziness and lightheadedness which is better here since getting IVF's in ED.  No xh of kidney failure.   ROS - denies CP, no joint pain, no HA, no blurry vision, no rash, no diarrhea, no nausea/ vomiting, no dysuria, no difficulty voiding   Past Medical History  Past Medical History:  Diagnosis Date   High cholesterol    Hypertension    Past Surgical History  Past Surgical History:  Procedure Laterality Date   ABDOMINAL HYSTERECTOMY     INCISION AND DRAINAGE PERIRECTAL ABSCESS N/A 04/18/2016   Procedure: IRRIGATION AND DEBRIDEMENT PERIRECTAL ABSCESS;  Surgeon: Excell Seltzer, MD;  Location: [facility];  Service: General;  Laterality: N/A;   Family History History reviewed. No pertinent family history. Social History  reports that she has been smoking cigarettes. She has been smoking an average of .25 packs per day. She has never used smokeless tobacco. She reports current alcohol use. She reports that she does not currently use  drugs. Allergies No Known Allergies Home medications Prior to Admission medications   Medication Sig Start Date End Date Taking? Authorizing Provider  amoxicillin-clavulanate (AUGMENTIN) 875-125 MG tablet Take 1 tablet by mouth 2 (two) times daily. 04/19/16   Meuth, Brooke A, PA-C  losartan (COZAAR) 50 MG tablet Take 50 mg by mouth daily.  09/26/15 09/25/16  [provider]  naphazoline-glycerin (CLEAR EYES) 0.012-0.2 % SOLN Place 1-2 drops into both eyes 4 (four) times daily as needed for irritation.    [provider]  oxyCODONE-acetaminophen (PERCOCET/ROXICET) 5-325 MG tablet Take 1-2 tablets by mouth every 8 (eight) hours as needed for moderate pain. 04/19/16   Meuth, Brooke A, PA-C  pravastatin (PRAVACHOL) 80 MG tablet Take 80 mg by mouth daily.  09/29/15 09/28/16  [provider]     Vitals:   05/09/22 1830 05/09/22 1845 05/09/22 1900 05/09/22 2006  BP: 138/83 (!) 155/68 (!) 165/68 (!) 146/64  Pulse:   85 90  Resp: (!) 24 16 14 14  $ Temp:    97.6 F (36.4 C)  TempSrc:    Oral  SpO2: 95%  97%   Weight:    60.9 kg  Height:    5' 6"$  (1.676 m)   Exam Gen alert, no distress No rash, cyanosis or gangrene Sclera anicteric, throat clear  No jvd or bruits, flat neck veins Chest clear bilat to bases, no rales/ wheezing RRR no  MRG Abd soft ntnd no mass or ascites +bs GU defer MS no joint effusions or deformity Ext no LE or UE edema, no wounds or ulcers Neuro is alert, Ox 3 , nf   Home meds include - losartan 50 qd, percocet, pravachol, augmentin, prns/ vits    Last creat 0.65 from 03/26/22 in [place], [place] abd wo contrast - normal appearing kidneys bilat w/o any hydronephrosis    UA is pending  Assessment/ Plan: AKI - b/l creat from Mar 26 2022 was 0.65. Creat here is 13.8 in the setting of a prolonged likely viral illness w/ nasal congestion, N/V/D. BP here is normal. Pt is sig volume depleted. UA is unremarkable and CT abd shows no  obstruction. Suspect AKI due to severe vol depletion + ARB effects, also possible is ATN due to severe hyperbilirubinemia. SP 2 L NS bolus in ED, and getting NS at 150 cc/hr. Still dry, will give another bolus of 2 L over 6 hrs. Get urine lytes and UA when voids. F/u labs in am. Will follow.  Abnormal LFT's / jaundice/ hyperbilirubinemia - per pmd HTN - no need for BP lowering meds at this time.  Avoiding arb/ ACEi's nor now.   Kelly Splinter  MD [facility] 05/09/2022, 9:06 PM  Recent Labs  Lab 05/09/22 1334  HGB 11.1*  ALBUMIN 3.0*  CALCIUM 8.9  CREATININE 13.89*  K 4.2   Inpatient medications:

## 2022-05-09 NOTE — Assessment & Plan Note (Signed)
Admit to telemetry bed. Nephrology consultation. Continue with aggressive IVF resuscitation. CT abd/pelvis negative for ureteral obstruction. Still awaiting UA.

## 2022-05-09 NOTE — ED Notes (Signed)
[facility] at bedside to transport pt to [facility]. Pt signed consent to transfer form.

## 2022-05-09 NOTE — Assessment & Plan Note (Signed)
Hold losartan due to ARF. If BP elevated, can restart low dose norvasc.

## 2022-05-09 NOTE — Subjective & Objective (Signed)
CC: N/V/D HPI: 67 year old African-American female history of hypertension, hyperlipidemia presents to the ER today with a 10-day history of persistent nausea, vomiting, diarrhea.  She states that on Wednesday morning February 7, she woke up and had a normal breakfast.  She states that as soon as she started eating, she felt the urge to vomit.  She vomited then immediately had diarrhea.  She denies any illnesses at home.  She lives with her daughter.  She denies any takeout food or eating out in restaurants recently.  Denies any fever.  She states that over the next 10 days, she has had persistent vomiting every time she eats or drinks anything.  This is immediately followed by diarrhea after she tries to swallow.  She states that even after she vomits up what she has recently drank or eaten, she will have immediate diarrhea.  She has not been able to take her medicines since she started vomiting.  She finally presented to the ER today due to persistent vomiting and diarrhea.  On arrival temp 97.6 heart rate 89 blood pressure 138/77.  Labs showed a white count of 14.5, hemoglobin 0.1, platelets of 211  Sodium 132, potassium 4.2, bicarb 15, BUN of 104, creatinine 13.89  AST of 608, ALT of 913, alk phos of 770, total bili of 20, direct bilirubin of 16.2, lipase of 1118.  CT abdomen pelvis without contrast was negative for any pancreatic inflammatory changes or masses.  There is no hydroureteronephrosis.  Urinary bladder was decompressed.  Right upper quadrant ultrasound demonstrated gallbladder sludge without sonographic evidence of acute cholecystitis.  Common bile duct was 0.5 cm.  Gallbladder wall was not thickened.  Patient given 2 L of normal saline.  She has not urinated yet.  Patient transferred to [facility].  EDP has contacted Dr. Fuller Plan with GI and Dr. Jonnie Finner with nephrology.

## 2022-05-09 NOTE — Assessment & Plan Note (Addendum)
With her elevated LFTs, direct hyperbilirubinemia and N/V/Diarrhea, concern for acute hepatitis. Awaiting acute hepatitis panel. GI consult pending

## 2022-05-09 NOTE — ED Provider Notes (Signed)
[facility] Provider Note   CSN: [id number] Arrival date & time: 05/09/22  1250     History  Chief Complaint  Patient presents with   Emesis    Wendy Randolph is a 67 y.o. female here presenting with vomiting and diarrhea.  Patient has been having vomiting diarrhea for the last 10 days.  Patient states that she has watery diarrhea about 5-6 times a day.  Patient also cannot keep anything down.  Patient denies history of liver or kidney problems.  Denies any previous abdominal surgeries.  Denies any sick contacts.  The history is provided by the patient.       Home Medications Prior to Admission medications   Medication Sig Start Date End Date Taking? Authorizing Provider  amoxicillin-clavulanate (AUGMENTIN) 875-125 MG tablet Take 1 tablet by mouth 2 (two) times daily. 04/19/16   Meuth, Brooke A, PA-C  losartan (COZAAR) 50 MG tablet Take 50 mg by mouth daily.  09/26/15 09/25/16  [provider]  naphazoline-glycerin (CLEAR EYES) 0.012-0.2 % SOLN Place 1-2 drops into both eyes 4 (four) times daily as needed for irritation.    [provider]  oxyCODONE-acetaminophen (PERCOCET/ROXICET) 5-325 MG tablet Take 1-2 tablets by mouth every 8 (eight) hours as needed for moderate pain. 04/19/16   Meuth, Brooke A, PA-C  pravastatin (PRAVACHOL) 80 MG tablet Take 80 mg by mouth daily.  09/29/15 09/28/16  [provider]      Allergies    Patient has no known allergies.    Review of Systems   Review of Systems  Gastrointestinal:  Positive for vomiting.  All other systems reviewed and are negative.   Physical Exam Updated Vital Signs BP (!) 151/67   Pulse 89   Temp 97.6 F (36.4 C) (Oral)   Resp 15   Ht 5' 6"$  (1.676 m)   Wt 60.3 kg   SpO2 98%   BMI 21.47 kg/m  Physical Exam Vitals and nursing note reviewed.  Constitutional:      Comments: Jaundice, dehydrated  HENT:     Head: Normocephalic.     Nose: Nose  normal.     Mouth/Throat:     Mouth: Mucous membranes are dry.  Eyes:     Pupils: Pupils are equal, round, and reactive to light.     Comments: Obvious jaundice  Cardiovascular:     Rate and Rhythm: Normal rate and regular rhythm.  Pulmonary:     Effort: Pulmonary effort is normal.     Breath sounds: Normal breath sounds.  Abdominal:     Comments: Distended and mild epigastric tenderness  Musculoskeletal:        General: Normal range of motion.     Cervical back: Normal range of motion and neck supple.  Skin:    General: Skin is warm.     Capillary Refill: Capillary refill takes less than 2 seconds.  Neurological:     General: No focal deficit present.     Mental Status: She is oriented to person, place, and time.  Psychiatric:        Mood and Affect: Mood normal.        Behavior: Behavior normal.     ED Results / Procedures / Treatments   Labs (all labs ordered are listed, but only abnormal results are displayed) Labs Reviewed  CBC WITH DIFFERENTIAL/PLATELET - Abnormal; Notable for the following components:      Result Value   WBC 14.5 (*)  RBC 3.62 (*)    Hemoglobin 11.1 (*)    HCT 32.2 (*)    nRBC 8.0 (*)    Neutro Abs 10.3 (*)    Monocytes Absolute 1.6 (*)    Abs Immature Granulocytes 0.52 (*)    All other components within normal limits  LIPASE, BLOOD - Abnormal; Notable for the following components:   Lipase 1,118 (*)    All other components within normal limits  COMPREHENSIVE METABOLIC PANEL - Abnormal; Notable for the following components:   Sodium 132 (*)    CO2 15 (*)    Glucose, Bld 103 (*)    BUN 104 (*)    Creatinine, Ser 13.89 (*)    Albumin 3.0 (*)    AST 608 (*)    ALT 913 (*)    Alkaline Phosphatase 770 (*)    Total Bilirubin 20.0 (*)    GFR, Estimated 3 (*)    Anion gap 18 (*)    All other components within normal limits  URINALYSIS, ROUTINE W REFLEX MICROSCOPIC  HEPATITIS PANEL, ACUTE  BILIRUBIN, DIRECT    EKG EKG  Interpretation  Date/Time:  Sunday May 09 2022 15:24:13 EST Ventricular Rate:  87 PR Interval:  133 QRS Duration: 87 QT Interval:  398 QTC Calculation: 479 R Axis:   63 Text Interpretation: Sinus rhythm Left atrial enlargement No previous ECGs available Confirmed by Wandra Arthurs [id number]) on 05/09/2022 3:39:13 PM  Radiology No results found.  Procedures Procedures    CRITICAL CARE Performed by: Wandra Arthurs   Total critical care time: 34 minutes  Critical care time was exclusive of separately billable procedures and treating other patients.  Critical care was necessary to treat or prevent imminent or life-threatening deterioration.  Critical care was time spent personally by me on the following activities: development of treatment plan with patient and/or surrogate as well as nursing, discussions with consultants, evaluation of patient's response to treatment, examination of patient, obtaining history from patient or surrogate, ordering and performing treatments and interventions, ordering and review of laboratory studies, ordering and review of radiographic studies, pulse oximetry and re-evaluation of patient's condition.   Medications Ordered in ED Medications  sodium chloride 0.9 % bolus 1,000 mL (1,000 mLs Intravenous New Bag/Given 05/09/22 1541)  ondansetron (ZOFRAN) injection 4 mg (4 mg Intravenous Given 05/09/22 1541)    ED Course/ Medical Decision Making/ A&P                             Medical Decision Making Wendy Randolph is a 67 y.o. female here presenting with vomiting and diarrhea and abdominal distention.  Patient appears jaundiced.  I am concerned that she may have pancreatic cancer causing painless jaundice.  Also consider cancer of the biliary tree as well.  Plan to get CBC and CMP and CT abdomen pelvis.  5:42 PM I reviewed patient's labs and independently interpreted imaging studies.  Labs showed bilirubin 20.  Patient has a AST of 600 and ALT of 900  and alk phos is 770.  Patient is a bicarb of 15 and a BUN of 104 with a creatinine of 14.  Patient's lipase is 1000 patient CT scan was reviewed by me and there was no obvious mass or evidence of dilated CBD.  Patient does have biliary sludge.  There is no signs of acute cholecystitis.  I discussed case with Dr. Fuller Plan from GI at [facility].  He states that patient will  likely need right upper quadrant ultrasound and potentially a MRCP or ERCP and GI to see him tomorrow.  I also discussed case with Dr. Jonnie Finner from nephrology.  He states that he also will see patient as a consult.  Of note, CT scan does not show any hydronephrosis.  I suspect the acute renal failure is from dehydration and volume contraction.  At this point, patient to be admitted to the hospitalist service for liver and kidney failure.  Problems Addressed: Acute liver failure without hepatic coma: acute illness or injury Biliary sludge: acute illness or injury Renal failure, unspecified chronicity: acute illness or injury  Amount and/or Complexity of Data Reviewed Labs: ordered. Decision-making details documented in ED Course. Radiology: ordered and independent interpretation performed. Decision-making details documented in ED Course. ECG/medicine tests: ordered and independent interpretation performed. Decision-making details documented in ED Course.  Risk Prescription drug management.    Final Clinical Impression(s) / ED Diagnoses Final diagnoses:  None    Rx / DC Orders ED Discharge Orders     None         Drenda Freeze, MD 05/09/22 1747

## 2022-05-09 NOTE — ED Notes (Signed)
RN unable to obtain lab work, Darl Householder MD aware.

## 2022-05-09 NOTE — Assessment & Plan Note (Signed)
With her elevated LFTs, direct hyperbilirubinemia and N/V/Diarrhea, concern for acute hepatitis. Awaiting acute hepatitis panel.

## 2022-05-09 NOTE — Assessment & Plan Note (Signed)
With her elevated LFTs, direct hyperbilirubinemia and N/V/Diarrhea, concern for acute hepatitis. Awaiting acute hepatitis panel. Also send C. Diff and viral diarrhea panel.

## 2022-05-09 NOTE — ED Triage Notes (Signed)
Pt c/o vomiting and diarrhea since Wednesday. Pt with c/o weakness and fatigue. Pt unable to take her regular prescribed medications due to vomiting.

## 2022-05-10 DIAGNOSIS — R748 Abnormal levels of other serum enzymes: Secondary | ICD-10-CM

## 2022-05-10 DIAGNOSIS — D649 Anemia, unspecified: Secondary | ICD-10-CM | POA: Diagnosis not present

## 2022-05-10 DIAGNOSIS — K922 Gastrointestinal hemorrhage, unspecified: Secondary | ICD-10-CM | POA: Diagnosis not present

## 2022-05-10 DIAGNOSIS — D72825 Bandemia: Secondary | ICD-10-CM

## 2022-05-10 DIAGNOSIS — A09 Infectious gastroenteritis and colitis, unspecified: Secondary | ICD-10-CM

## 2022-05-10 DIAGNOSIS — E8729 Other acidosis: Secondary | ICD-10-CM

## 2022-05-10 DIAGNOSIS — R7989 Other specified abnormal findings of blood chemistry: Secondary | ICD-10-CM | POA: Diagnosis not present

## 2022-05-10 DIAGNOSIS — N179 Acute kidney failure, unspecified: Secondary | ICD-10-CM | POA: Diagnosis not present

## 2022-05-10 LAB — URINALYSIS, COMPLETE (UACMP) WITH MICROSCOPIC
Bacteria, UA: NONE SEEN
Glucose, UA: 100 mg/dL — AB
Ketones, ur: NEGATIVE mg/dL
Leukocytes,Ua: NEGATIVE
Nitrite: NEGATIVE
Protein, ur: 100 mg/dL — AB
Specific Gravity, Urine: 1.02 (ref 1.005–1.030)
pH: 5.5 (ref 5.0–8.0)

## 2022-05-10 LAB — COMPREHENSIVE METABOLIC PANEL
ALT: 710 U/L — ABNORMAL HIGH (ref 0–44)
AST: 376 U/L — ABNORMAL HIGH (ref 15–41)
Albumin: 2.4 g/dL — ABNORMAL LOW (ref 3.5–5.0)
Alkaline Phosphatase: 664 U/L — ABNORMAL HIGH (ref 38–126)
Anion gap: 19 — ABNORMAL HIGH (ref 5–15)
BUN: 107 mg/dL — ABNORMAL HIGH (ref 8–23)
CO2: 11 mmol/L — ABNORMAL LOW (ref 22–32)
Calcium: 8.4 mg/dL — ABNORMAL LOW (ref 8.9–10.3)
Chloride: 103 mmol/L (ref 98–111)
Creatinine, Ser: 14.63 mg/dL — ABNORMAL HIGH (ref 0.44–1.00)
GFR, Estimated: 2 mL/min — ABNORMAL LOW (ref >60)
Glucose, Bld: 77 mg/dL (ref 70–99)
Potassium: 4.3 mmol/L (ref 3.5–5.1)
Sodium: 133 mmol/L — ABNORMAL LOW (ref 135–145)
Total Bilirubin: 14.9 mg/dL — ABNORMAL HIGH (ref 0.3–1.2)
Total Protein: 5.7 g/dL — ABNORMAL LOW (ref 6.5–8.1)

## 2022-05-10 LAB — CBC WITH DIFFERENTIAL/PLATELET
Abs Immature Granulocytes: 0.3 10*3/uL — ABNORMAL HIGH (ref 0.00–0.07)
Abs Immature Granulocytes: 0.52 10*3/uL — ABNORMAL HIGH (ref 0.00–0.07)
Basophils Absolute: 0.3 10*3/uL — ABNORMAL HIGH (ref 0.0–0.1)
Basophils Absolute: 0.1 10*3/uL (ref 0.0–0.1)
Basophils Relative: 2 %
Basophils Relative: 1 %
Eosinophils Absolute: 0.2 10*3/uL (ref 0.0–0.5)
Eosinophils Absolute: 0.2 10*3/uL (ref 0.0–0.5)
Eosinophils Relative: 1 %
Eosinophils Relative: 2 %
HCT: 27.8 % — ABNORMAL LOW (ref 36.0–46.0)
HCT: 32.2 % — ABNORMAL LOW (ref 36.0–46.0)
Hemoglobin: 9 g/dL — ABNORMAL LOW (ref 12.0–15.0)
Hemoglobin: 11.1 g/dL — ABNORMAL LOW (ref 12.0–15.0)
Immature Granulocytes: 4 %
Lymphocytes Relative: 14 %
Lymphocytes Relative: 11 %
Lymphs Abs: 2.2 10*3/uL (ref 0.7–4.0)
Lymphs Abs: 1.7 10*3/uL (ref 0.7–4.0)
MCH: 30 pg (ref 26.0–34.0)
MCH: 30.7 pg (ref 26.0–34.0)
MCHC: 32.4 g/dL (ref 30.0–36.0)
MCHC: 34.5 g/dL (ref 30.0–36.0)
MCV: 92.7 fL (ref 80.0–100.0)
MCV: 89 fL (ref 80.0–100.0)
Metamyelocytes Relative: 1 %
Monocytes Absolute: 0.8 10*3/uL (ref 0.1–1.0)
Monocytes Absolute: 1.6 10*3/uL — ABNORMAL HIGH (ref 0.1–1.0)
Monocytes Relative: 5 %
Monocytes Relative: 11 %
Myelocytes: 1 %
Neutro Abs: 12.1 10*3/uL — ABNORMAL HIGH (ref 1.7–7.7)
Neutro Abs: 10.3 10*3/uL — ABNORMAL HIGH (ref 1.7–7.7)
Neutrophils Relative %: 76 %
Neutrophils Relative %: 71 %
Platelets: 167 10*3/uL (ref 150–400)
Platelets: 211 10*3/uL (ref 150–400)
RBC: 3 MIL/uL — ABNORMAL LOW (ref 3.87–5.11)
RBC: 3.62 MIL/uL — ABNORMAL LOW (ref 3.87–5.11)
RDW: 15 % (ref 11.5–15.5)
RDW: 14.5 % (ref 11.5–15.5)
Smear Review: NORMAL
WBC: 15.9 10*3/uL — ABNORMAL HIGH (ref 4.0–10.5)
WBC: 14.5 10*3/uL — ABNORMAL HIGH (ref 4.0–10.5)
nRBC: 4.3 % — ABNORMAL HIGH (ref 0.0–0.2)
nRBC: 7 /100 WBC — ABNORMAL HIGH
nRBC: 8 % — ABNORMAL HIGH (ref 0.0–0.2)

## 2022-05-10 LAB — RAPID URINE DRUG SCREEN, HOSP PERFORMED
Amphetamines: NOT DETECTED
Barbiturates: NOT DETECTED
Benzodiazepines: NOT DETECTED
Cocaine: NOT DETECTED
Opiates: NOT DETECTED
Tetrahydrocannabinol: NOT DETECTED

## 2022-05-10 LAB — CK: Total CK: 241 U/L — ABNORMAL HIGH (ref 38–234)

## 2022-05-10 LAB — C-REACTIVE PROTEIN: CRP: 5.4 mg/dL — ABNORMAL HIGH (ref <1.0)

## 2022-05-10 LAB — CBC
HCT: 32.8 % — ABNORMAL LOW (ref 36.0–46.0)
Hemoglobin: 10.7 g/dL — ABNORMAL LOW (ref 12.0–15.0)
MCH: 30.2 pg (ref 26.0–34.0)
MCHC: 32.6 g/dL (ref 30.0–36.0)
MCV: 92.7 fL (ref 80.0–100.0)
Platelets: 115 10*3/uL — ABNORMAL LOW (ref 150–400)
RBC: 3.54 MIL/uL — ABNORMAL LOW (ref 3.87–5.11)
RDW: 15.3 % (ref 11.5–15.5)
WBC: 15.8 10*3/uL — ABNORMAL HIGH (ref 4.0–10.5)
nRBC: 2.6 % — ABNORMAL HIGH (ref 0.0–0.2)

## 2022-05-10 LAB — HIV ANTIBODY (ROUTINE TESTING W REFLEX): HIV Screen 4th Generation wRfx: NONREACTIVE

## 2022-05-10 LAB — C DIFFICILE QUICK SCREEN W PCR REFLEX
C Diff antigen: POSITIVE — AB
C Diff toxin: NEGATIVE

## 2022-05-10 LAB — IRON AND TIBC
Iron: 89 ug/dL (ref 28–170)
Saturation Ratios: 36 % — ABNORMAL HIGH (ref 10.4–31.8)
TIBC: 245 ug/dL — ABNORMAL LOW (ref 250–450)
UIBC: 156 ug/dL

## 2022-05-10 LAB — FERRITIN: Ferritin: >7500 ng/mL — ABNORMAL HIGH (ref 11–307)

## 2022-05-10 LAB — MAGNESIUM: Magnesium: 1.7 mg/dL (ref 1.7–2.4)

## 2022-05-10 LAB — PROTEIN / CREATININE RATIO, URINE
Creatinine, Urine: 72 mg/dL
Protein Creatinine Ratio: 3.17 mg/mg{Cre} — ABNORMAL HIGH (ref 0.00–0.15)
Total Protein, Urine: 228 mg/dL

## 2022-05-10 LAB — PROTIME-INR
INR: 1 (ref 0.8–1.2)
Prothrombin Time: 13.4 seconds (ref 11.4–15.2)

## 2022-05-10 LAB — HEPATITIS PANEL, ACUTE
HCV Ab: NONREACTIVE
Hep A IgM: NONREACTIVE
Hep B C IgM: NONREACTIVE
Hepatitis B Surface Ag: NONREACTIVE

## 2022-05-10 LAB — CLOSTRIDIUM DIFFICILE BY PCR, REFLEXED: Toxigenic C. Difficile by PCR: NEGATIVE

## 2022-05-10 LAB — HEMOGLOBIN A1C
Hgb A1c MFr Bld: 5 % (ref 4.8–5.6)
Mean Plasma Glucose: 96.8 mg/dL

## 2022-05-10 LAB — SEDIMENTATION RATE: Sed Rate: 112 mm/hr — ABNORMAL HIGH (ref 0–22)

## 2022-05-10 LAB — TSH: TSH: 0.509 u[IU]/mL (ref 0.350–4.500)

## 2022-05-10 MED ORDER — CHLORHEXIDINE GLUCONATE CLOTH 2 % EX PADS
6.0000 | MEDICATED_PAD | Freq: Every day | CUTANEOUS | Status: DC
  Administered 2022-05-10 – 2022-05-11 (×2): 6 via TOPICAL

## 2022-05-10 MED ORDER — SODIUM CHLORIDE 0.9 % IV BOLUS: 500.0000 mL | Freq: Once | INTRAVENOUS | Status: DC

## 2022-05-10 MED ORDER — GERHARDT'S BUTT CREAM
TOPICAL_CREAM | Freq: Four times a day (QID) | CUTANEOUS | Status: DC
  Administered 2022-05-10 – 2022-05-13 (×3): 1 via TOPICAL
  Filled 2022-05-10 (×3): qty 1

## 2022-05-10 MED ORDER — STERILE WATER FOR INJECTION IV SOLN
INTRAVENOUS | Status: AC
  Filled 2022-05-10 (×2): qty 1000

## 2022-05-10 MED ORDER — LACTATED RINGERS IV SOLN
INTRAVENOUS | Status: DC
  Administered 2022-05-10: 150 mL/h via INTRAVENOUS

## 2022-05-10 MED ORDER — SODIUM CHLORIDE 0.9 % IV BOLUS
1000.0000 mL | Freq: Once | INTRAVENOUS | Status: AC
  Administered 2022-05-10: 1000 mL via INTRAVENOUS

## 2022-05-10 MED ORDER — SODIUM BICARBONATE 650 MG PO TABS
650.0000 mg | ORAL_TABLET | Freq: Three times a day (TID) | ORAL | Status: DC
  Administered 2022-05-10 (×3): 650 mg via ORAL
  Filled 2022-05-10 (×3): qty 1

## 2022-05-10 NOTE — Progress Notes (Signed)
Dear Doctor:  This patient has been identified as a candidate for a central line for the following reason (s): drug pH or osmolality (causing phlebitis, infiltration in 24 hours) If you agree, please write an order for the indicated device. For any questions contact the Vascular Access Team at [phone] if no answer, please leave a message.  Thank you for supporting the early vascular access assessment program.

## 2022-05-10 NOTE — Progress Notes (Signed)
Mobility Specialist Progress Note:   05/10/22 1623  Mobility  Activity Transferred to/from [facility]  Level of Assistance Minimal assist, patient does 75% or more  Assistive Device BSC  Distance Ambulated (ft) 4 ft  Activity Response Tolerated well  $Mobility charge 1 Mobility   Pt in bed asking to use bathroom. No complaints of pain. [facility]A to get pt to [facility] and back to bed. Left in bed with call bell in reach and all needs met.   Gareth Eagle Nahla Lukin Mobility Specialist Please contact via [employer] or  Rehab Office at [phone]

## 2022-05-10 NOTE — Consult Note (Signed)
Consultation Note   Referring Provider:  [facility] PCP: Patient, No Pcp Per Primary Gastroenterologist: [facility])      Reason for consultation:  vomiting, diarrhea, elevated liver tests and elevated lipase.    Hospital Day: 2  Assessment    # 67 yo female with acute vomiting, diarrhea, leukocytosis.  WBC 15.9 with elevated absolute neutrophil count, normal lymphocytes. Also with elevated liver chemistries in mixed pattern and elevated lipase. No findings of pancreatitis,  acute cholecystitis, choledocholithiasis or liver abnormalities on non-contrast CT scan or [place]. Viral hepatitis panel negative.   Not clear about etiology of markedly abnormal liver chemistries but possibly secondary to same infectious process which caused GI symptoms.    In setting of severe volume depletion would think about ischemia but bilirubin is higher than expected.  Mentation is normal. INR normal at 1.0.   # Elevated lipase. Possibly secondary to vomiting and ? Decreased lipase clearance in setting of AKI. No abdominal pain and no findings of pancreatitis on CT scan ( though it was a non-contrast study)  # AKI. Suspect AKI secondary to volume depletion related to gastroenteritis. Nephrology is following.   # Iron deficiency anemia, followed by [facility]. Hgb 9.0 after IVF. Presenting hgb was 11.1. Would have expected it to be higher in setting of severe volume depletion. Her baseline hgb is around 11.6  Plan   Await GI path, C-diff studies Trend liver tests. If number do not continue to improve then will proceed with further testing  HPI   Patient is a 67 y.o. year old female with a past medical history of HLD, prediabets, iron deficiency anemia, hemorrhoids, perirectal abscesses, PUD, diverticulosis, small bowel AVMs, diverticular disease.   See [facility] for any additional medical problems.  Patient followed by [facility] for  iron deficiency anemia. She has been treated with oral iron and daily PPI. Records in Care Everywhere reviewed. On 03/26/22 her hgb was 11.7. In September 2023 ferritin was 657.   Patient admitted yesterday with vomiting and diarrhea since Wed( 5 days ago). She thinks she may of had a fever with onset of symptoms but didn't check. The N/V resolved a few days ago but still having watery diarrhea.  She has been weak, unable to tolerate her usual medications. Had hard time even drinking fluids due to them triggering diarrhea. She denies abdominal pain just "sore" mid lower abdomen. No recent antibiotics. She takes iron,  Vitamin D and ibuprofen but denies use of any other OTC meds / supplements. She drinks Etoh occasionally. No illicit drug use. Prior to last Wed her BMs were normal ( solid).   Labs notable for AKI with a creatinine of 13.8,  markedly abnormal liver chemistries in a mixed pattern and elevated lipase of 1118.     Some improvement in liver tests today.  Today Alk phos 664, t bili 14.9, AST 376, ALT 710.  Ferritin > 7500. Acute viral hepatitis panel negative. CT scan  wo contrast >> small amount of biliary sludge in gallbladder, no findings to suggest acute cholecystitis. No pancreas abnormalities. No mention of CBD dilation. US>> gallbladder sludge. No findings of acute cholecystitis. CBD 0.5 cm.   Previous GI Evaluation:  Unable  to see actual reports but according to 11/30/21 office note she has had below procedures.   EGD 05/2020 was noted superficial ulcers with biopsies negative for H. pylori. Colonoscopy 04/2019 with polyps and diverticulosis, placed on a 10-year recall. Capsule endoscopy 09/2020 noted small AVMs in the small bowel, otherwise normal.    Recent Labs and Imaging US Abdomen Limited RUQ (LIVER/GB)  Result Date: 05/09/2022 CLINICAL DATA:  Right upper quadrant pain, jaundice EXAM: ULTRASOUND ABDOMEN LIMITED RIGHT UPPER QUADRANT COMPARISON:  Same-day CT FINDINGS: Gallbladder:  Sludge in the gallbladder. No discrete gallstones. No gallbladder wall thickening. No sonographic Murphy sign noted by sonographer. Common bile duct: Diameter: 0.5 cm Liver: No focal lesion identified. Within normal limits in parenchymal echogenicity. Portal vein is patent on color Doppler imaging with normal direction of blood flow towards the liver. Other: None. IMPRESSION: Gallbladder sludge without ultrasound evidence of acute cholecystitis. Consider nuclear scintigraphic HIDA scan to further evaluate if there is clinical concern for cholecystitis or biliary ductal obstruction. Electronically Signed   By: Delanna Ahmadi M.D.   On: 05/09/2022 17:52   CT ABDOMEN PELVIS WO CONTRAST  Result Date: 05/09/2022 CLINICAL DATA:  67 year old female with history of acute onset of abdominal pain. Left renal failure. Vomiting and diarrhea. EXAM: CT ABDOMEN AND PELVIS WITHOUT CONTRAST TECHNIQUE: Multidetector CT imaging of the abdomen and pelvis was performed following the standard protocol without IV contrast. RADIATION DOSE REDUCTION: This exam was performed according to the departmental dose-optimization program which includes automated exposure control, adjustment of the mA and/or kV according to patient size and/or use of iterative reconstruction technique. COMPARISON:  CTA of the abdomen and pelvis 04/17/2016. FINDINGS: Lower chest: Atherosclerosis in the descending thoracic aorta as well as the right coronary artery. Hepatobiliary: No definite suspicious cystic or solid hepatic lesions are confidently identified on today's noncontrast CT examination. There is a small amount of amorphous intermediate attenuation material lying dependently in the gallbladder, likely to represent biliary sludge. Gallbladder is moderately distended. Gallbladder wall does not appear thickened. No pericholecystic fluid or surrounding inflammatory changes. Pancreas: No definite pancreatic mass or peripancreatic fluid collections or  inflammatory changes are noted on today's noncontrast CT examination. Spleen: Unremarkable. Adrenals/Urinary Tract: In the anterior aspect of the lower pole the left kidney there is a 1.5 cm low-attenuation lesion, incompletely characterized on today's noncontrast CT examination, but statistically likely to represent cysts (no imaging follow-up recommended). Right kidney and bilateral adrenal glands are otherwise normal in appearance. No hydroureteronephrosis. Urinary bladder is nearly completely decompressed, but otherwise unremarkable in appearance. Stomach/Bowel: The unenhanced appearance of the stomach is normal. No pathologic dilatation of small bowel or colon. Numerous colonic diverticuli are noted, without surrounding inflammatory changes to indicate an acute diverticulitis at this time. Normal appendix. Vascular/Lymphatic: Atherosclerotic calcifications are noted in the abdominal aorta. No lymphadenopathy noted in the abdomen or pelvis. Reproductive: Status post hysterectomy. Ovaries are not confidently identified may be surgically absent or atrophic. Other: No significant volume of ascites.  No pneumoperitoneum. Musculoskeletal: There are no aggressive appearing lytic or blastic lesions noted in the visualized portions of the skeleton. IMPRESSION: 1. No acute findings are noted in the abdomen or pelvis to account for the patient's symptoms. 2. Small amount of biliary sludge lying dependently in the gallbladder. No imaging findings to suggest an acute cholecystitis at this time. 3. Colonic diverticulosis without evidence of acute diverticulitis at this time. 4. Aortic atherosclerosis. 5. Additional incidental findings, as above. Electronically Signed   By: Vinnie Langton  M.D.   On: 05/09/2022 16:43   DG Lumbar Spine Complete  Result Date: 04/12/2022 CLINICAL DATA:  Low back pain over the last 2 weeks EXAM: LUMBAR SPINE - COMPLETE 4+ VIEW COMPARISON:  None Available. FINDINGS: No malalignment. No  regional fracture. No disc space narrowing. Ordinary endplate osteophytes in the lower lumbar spine. No significant facet arthropathy. No pars defect. Aortic atherosclerosis incidentally noted. 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 IMPRESSION: No acute or traumatic finding. Ordinary endplate osteophytes in the lower lumbar spine. Some left SI joint arthritis. Electronically Signed   By: Nelson Chimes M.D.   On: 04/12/2022 11:12    Labs:  Recent Labs    05/09/22 1334 05/10/22 0038  WBC 14.5* 15.9*  HGB 11.1* 9.0*  HCT 32.2* 27.8*  PLT 211 167   Recent Labs    05/09/22 1334 05/10/22 0038  NA 132* 133*  K 4.2 4.3  CL 99 103  CO2 15* 11*  GLUCOSE 103* 77  BUN 104* 107*  CREATININE 13.89* 14.63*  CALCIUM 8.9 8.4*   Recent Labs    05/09/22 1757 05/10/22 0038  PROT  --  5.7*  ALBUMIN  --  2.4*  AST  --  376*  ALT  --  710*  ALKPHOS  --  664*  BILITOT  --  14.9*  BILIDIR 16.2*  --    Recent Labs    05/10/22 0038  HEPBSAG NON REACTIVE  HCVAB NON REACTIVE  HEPAIGM NON REACTIVE  HEPBIGM NON REACTIVE   Recent Labs    05/10/22 0038  LABPROT 13.4  INR 1.0    Past Medical History:  Diagnosis Date   High cholesterol    Hypertension    Perirectal abscess 04/17/2016    Past Surgical History:  Procedure Laterality Date   ABDOMINAL HYSTERECTOMY     INCISION AND DRAINAGE PERIRECTAL ABSCESS N/A 04/18/2016   Procedure: IRRIGATION AND DEBRIDEMENT PERIRECTAL ABSCESS;  Surgeon: Excell Seltzer, MD;  Location: [facility];  Service: General;  Laterality: N/A;    History reviewed. No pertinent family history.  Prior to Admission medications   Medication Sig Start Date End Date Taking? Authorizing Provider  amLODipine (NORVASC) 5 MG tablet Take 5 mg by mouth daily. 03/29/22  Yes [provider]  Ferrous Sulfate (IRON PO) Take 1 tablet by mouth daily.   Yes [provider]  ibuprofen (ADVIL) 200 MG tablet Take 200-600 mg by mouth every  6 (six) hours as needed for mild pain.   Yes [provider]  latanoprost (XALATAN) 0.005 % ophthalmic solution Place 1 drop into both eyes at bedtime. 05/02/22  Yes [provider]  losartan (COZAAR) 50 MG tablet Take 50 mg by mouth daily.  09/26/15 05/10/22 Yes [provider]  pantoprazole (PROTONIX) 40 MG tablet Take 40 mg by mouth daily. 03/04/22  Yes [provider]  rosuvastatin (CRESTOR) 40 MG tablet Take 40 mg by mouth daily. 12/06/21  Yes [provider]  VITAMIN D PO Take 1 tablet by mouth daily.   Yes [provider]    Current Facility-Administered Medications  Medication Dose Route Frequency Provider Last Rate Last Admin   Chlorhexidine Gluconate Cloth 2 % PADS 6 each  6 each Topical Daily Gonfa, Taye T, MD       labetalol (NORMODYNE) injection 10 mg  10 mg Intravenous Q4H PRN Kristopher Oppenheim, DO       naphazoline-glycerin (CLEAR EYES REDNESS) ophth solution 1-2 drop  1-2 drop Both  Eyes QID PRN Kristopher Oppenheim, DO       ondansetron [facility] tablet 4 mg  4 mg Oral Q6H PRN Kristopher Oppenheim, DO       Or   ondansetron [facility]) injection 4 mg  4 mg Intravenous Q6H PRN Kristopher Oppenheim, DO       sodium bicarbonate 150 mEq in sterile water 1,150 mL infusion   Intravenous Continuous Claudia Desanctis, MD       sodium bicarbonate tablet 650 mg  650 mg Oral TID Mercy Riding, MD   650 mg at 05/10/22 0846    Allergies as of 05/09/2022   (No Known Allergies)    Social History   Socioeconomic History   Marital status: Single    Spouse name: Not on file   Number of children: Not on file   Years of education: Not on file   Highest education level: Not on file  Occupational History   Not on file  Tobacco Use   Smoking status: Every Day    Packs/day: 0.25    Types: Cigarettes   Smokeless tobacco: Never  Substance and Sexual Activity   Alcohol use: Yes    Alcohol/week: 3.0 standard drinks of alcohol    Types: 3 Shots of liquor per week   Drug use: Not  Currently    Comment: Socially   Sexual activity: Not on file  Other Topics Concern   Not on file  Social History Narrative   Not on file   Social Determinants of Health   Financial Resource Strain: Not on file  Food Insecurity: Not on file  Transportation Needs: Not on file  Physical Activity: Not on file  Stress: Not on file  Social Connections: Not on file  Intimate Partner Violence: Not on file    Review of Systems: All systems reviewed and negative except where noted in HPI.  Physical Exam: Vital signs in last 24 hours: Temp:  [97.6 F (36.4 C)-98.6 F (37 C)] 98.6 F (37 C) (02/19 0900) Pulse Rate:  [83-110] 83 (02/19 0721) Resp:  [12-24] 15 (02/19 1100) BP: (118-165)/(56-89) 148/64 (02/19 1100) SpO2:  [94 %-100 %] 95 % (02/19 0300) Weight:  [60.3 kg-62.4 kg] 62.4 kg (02/19 0515) Last BM Date : 05/10/22  General:  Alert female in NAD Psych:  Pleasant, cooperative. Normal mood and affect Eyes: Pupils equal Ears:  Normal auditory acuity Nose: No deformity, discharge or lesions Neck:  Supple, no masses felt Lungs:  Clear to auscultation.  Heart:  Regular rate, regular rhythm.  Abdomen:  Soft, nondistended, mild mid lower abdominal tenderness. Active bowel sounds, no masses felt. Incontinent of watery brown stool Rectal :  Deferred Msk: Symmetrical without gross deformities.  Neurologic:  Alert, oriented, grossly normal neurologically Extremities : No edema Skin:  Intact without significant lesions.    Intake/Output from previous day: 02/18 0701 - 02/19 0700 In: 1854 [I.V.:110; IV Piggyback:1744] Out: -  Intake/Output this shift:  Total I/O In: 926.7 [I.V.:522.5; IV Piggyback:404.2] Out: 20 [Urine:20]    Principal Problem:   Acute renal failure (HCC) Active Problems:   Essential hypertension   Mixed hyperlipidemia   Elevated LFTs   Hyperbilirubinemia   Infectious diarrhea   Elevated lipase   High anion gap metabolic acidosis   Bandemia    Acute GI bleeding    Tye Savoy, NP-C @  05/10/2022, 11:31 AM

## 2022-05-10 NOTE — Progress Notes (Signed)
PROGRESS NOTE  Wendy Randolph [id number] DOB: October 17, 1955   PCP: Patient, No Pcp Per  Patient is from: Home.  Lives with his daughter.  Independently ambulates at baseline.  DOA: 05/09/2022 LOS: 1  Chief complaints Chief Complaint  Patient presents with   Emesis     Brief Narrative / Interim history: 67 year old F with PMH of HTN, HLD and tobacco use disorder presenting with nausea, vomiting and diarrhea for about a week and admitted for severe acute renal failure, elevated liver enzymes with markedly elevated bilirubin and lipase likely due to dehydration from GI loss in the setting of possible viral illness with concurrent NSAID use and ARB.  In ED, she had a stable vitals. Cr 14.  BUN 104.  Bicarb 15.  AST 608.  ALT 913.  ALP 770.  Total bili 20.  Direct bili 16.2.  Lipase 1118.  CT abdomen and pelvis without acute finding.  RUQ US demonstrated gallbladder sludge with CBD measuring 0.5 cm.  Patient was started on IV fluid and transferred to [facility] for further care after consultation with GI and nephrology.  Subjective: Seen and examined earlier this morning.  No major events overnight of this morning.  No major events overnight of this morning.  She reports about 7-8 watery bowel movements.  She denied blood in the stool.  However, staff noted some dark and bright red blood in the stool this morning.  She denies nausea or vomiting.  She states she has no nausea or vomiting at baseline.  She denies chest pain or shortness of breath.  She states she has note urinated despite IV fluid.  Objective: Vitals:   05/10/22 0515 05/10/22 0721 05/10/22 0800 05/10/22 0900  BP:  (!) 144/59 (!) 150/60 (!) 149/75  Pulse:  83    Resp:  17 14 15  $ Temp:  97.9 F (36.6 C)  98.6 F (37 C)  TempSrc:  Axillary  Oral  SpO2:      Weight: 62.4 kg     Height:        Examination:  GENERAL: No apparent distress.  Nontoxic. HEENT: MMM.  Vision and hearing grossly intact.  NECK: Supple.  No apparent  JVD.  RESP:  No IWOB.  Fair aeration bilaterally. CVS:  RRR. Heart sounds normal.  ABD/GI/GU: BS+. Abd soft.  Mild diffuse tenderness.  No rebound or guarding. MSK/EXT:  Moves extremities. No apparent deformity. No edema.  SKIN: no apparent skin lesion or wound NEURO: Awake, alert and oriented appropriately.  No apparent focal neuro deficit. PSYCH: Calm. Normal affect.   Procedures:  None  Microbiology summarized: MRSA PCR screen and reactive.  Assessment and plan: Principal Problem:   Acute renal failure (HCC) Active Problems:   Elevated LFTs   Hyperbilirubinemia   Essential hypertension   Mixed hyperlipidemia   Infectious diarrhea   Elevated lipase   High anion gap metabolic acidosis   Bandemia   Acute GI bleeding  Acute renal failure/high anion gap metabolic acidosis/hyponatremia: Baseline Cr 0.65.  AKI likely prerenal insult from dehydration due to GI loss: Concurrent use of NSAID and ARB.  CT abdomen and pelvis without obstructive etiology.  UA without significant finding. Recent Labs    05/09/22 1334 05/10/22 0038  BUN 104* 107*  CREATININE 13.89* 14.63*  -Continue IV fluid per nephrology -Continue p.o. sodium bicarbonate -Agree with inserting Foley catheter -Monitor urine output closely -Avoid nephrotoxic meds. -Check CK and UDS   Elevated liver enzymes/hyperbilirubinemia: Seems to be improving.  CT abdomen and  pelvis and RUQ [place] without significant finding other than some biliary sludge.  Acute hepatitis panel and HIV nonreactive. Recent Labs  Lab 05/09/22 1334 05/10/22 0038  AST 608* 376*  ALT 913* 710*  ALKPHOS 770* 664*  BILITOT 20.0* 14.9*  PROT 7.1 5.7*  ALBUMIN 3.0* 2.4*  -Avoid hepatotoxic meds -Continue monitoring -Check CK -GI following.   Elevated lipase: No radiologic evidence of pancreatitis or biliary stone.  Denies drinking alcohol. -Continue monitoring   Infectious diarrhea: Reports 7-8 watery stool overnight.  Abdomen tender but no  rebound or guarding.  Staff not able to collect a stool sample yet.  -Requested RN to send stool sample for C. difficile, GIP and stool chemistry. -Check CRP and ESR  Essential hypertension: BP slightly elevated likely from IV fluid hydration. -Resume home amlodipine -Continue IV labetalol as needed -Continue holding losartan  GI bleed/normocytic anemia: RN staff noted some darker red stool this morning.  Drop in Hgb likely dilutional Recent Labs    05/09/22 1334 05/10/22 0038  HGB 11.1* 9.0*  -Discontinue subcu heparin -SCD for VTE prophylaxis -Recheck CBC this afternoon  Mixed hyperlipidemia -Hold statin due to elevated liver enzymes  Leukocytosis/bandemia: -Continue monitoring  Body mass index is 22.2 kg/m.           DVT prophylaxis:  Place and maintain sequential compression device Start: 05/10/22 0934 SCDs Start: 05/09/22 2209  Code Status: Full code Family Communication: None at bedside Level of care: Progressive Status is: Inpatient Remains inpatient appropriate because: AKI, elevated liver enzymes and ongoing diarrhea   Final disposition: Likely home once medically stable Consultants:  Nephrology Gastroenterology  55 minutes with more than 50% spent in reviewing records, counseling patient/family and coordinating care.   Sch Meds:  Scheduled Meds:  Chlorhexidine Gluconate Cloth  6 each Topical Daily   sodium bicarbonate  650 mg Oral TID   Continuous Infusions:  sodium bicarbonate 150 mEq in sterile water 1,150 mL infusion     PRN Meds:.labetalol, naphazoline-glycerin, ondansetron **OR** ondansetron (ZOFRAN) IV  Antimicrobials: Anti-infectives (From admission, onward)    None        I have personally reviewed the following labs and images: CBC: Recent Labs  Lab 05/09/22 1334 05/10/22 0038  WBC 14.5* 15.9*  NEUTROABS 10.3* 12.1*  HGB 11.1* 9.0*  HCT 32.2* 27.8*  MCV 89.0 92.7  PLT 211 167   BMP &GFR Recent Labs  Lab  05/09/22 1334 05/10/22 0038  NA 132* 133*  K 4.2 4.3  CL 99 103  CO2 15* 11*  GLUCOSE 103* 77  BUN 104* 107*  CREATININE 13.89* 14.63*  CALCIUM 8.9 8.4*  MG  --  1.7   Estimated Creatinine Clearance: 3.5 mL/min (A) (by C-G formula based on SCr of 14.63 mg/dL (H)). Liver & Pancreas: Recent Labs  Lab 05/09/22 1334 05/10/22 0038  AST 608* 376*  ALT 913* 710*  ALKPHOS 770* 664*  BILITOT 20.0* 14.9*  PROT 7.1 5.7*  ALBUMIN 3.0* 2.4*   Recent Labs  Lab 05/09/22 1334  LIPASE 1,118*   No results for input(s): "AMMONIA" in the last 168 hours. Diabetic: Recent Labs    05/10/22 0038  HGBA1C 5.0   No results for input(s): "GLUCAP" in the last 168 hours. Cardiac Enzymes: No results for input(s): "CKTOTAL", "CKMB", "CKMBINDEX", "TROPONINI" in the last 168 hours. No results for input(s): "PROBNP" in the last 8760 hours. Coagulation Profile: Recent Labs  Lab 05/10/22 0038  INR 1.0   Thyroid Function Tests: Recent Labs  05/10/22 0038  TSH 0.509   Lipid Profile: No results for input(s): "CHOL", "HDL", "LDLCALC", "TRIG", "CHOLHDL", "LDLDIRECT" in the last 72 hours. Anemia Panel: No results for input(s): "VITAMINB12", "FOLATE", "FERRITIN", "TIBC", "IRON", "RETICCTPCT" in the last 72 hours. Urine analysis:    Component Value Date/Time   COLORURINE AMBER (A) 04/17/2016 1830   APPEARANCEUR CLOUDY (A) 04/17/2016 1830   LABSPEC 1.023 04/17/2016 1830   PHURINE 6.0 04/17/2016 1830   GLUCOSEU NEGATIVE 04/17/2016 1830   HGBUR NEGATIVE 04/17/2016 1830   BILIRUBINUR SMALL (A) 04/17/2016 1830   KETONESUR NEGATIVE 04/17/2016 1830   PROTEINUR 30 (A) 04/17/2016 1830   NITRITE NEGATIVE 04/17/2016 1830   LEUKOCYTESUR NEGATIVE 04/17/2016 1830   Sepsis Labs: Invalid input(s): "PROCALCITONIN", "LACTICIDVEN"  Microbiology: Recent Results (from the past 240 hour(s))  MRSA Next Gen by PCR, Nasal     Status: None   Collection Time: 05/09/22  8:15 PM   Specimen: Nasal Mucosa;  Nasal Swab  Result Value Ref Range Status   MRSA by PCR Next Gen NOT DETECTED NOT DETECTED Final    Comment: (NOTE) The GeneXpert MRSA Assay (FDA approved for NASAL specimens only), is one component of a comprehensive MRSA colonization surveillance program. It is not intended to diagnose MRSA infection nor to guide or monitor treatment for MRSA infections. Test performance is not FDA approved in patients less than 67 years old. Performed at [facility], [place] [address], [place], [place] [address]     Radiology Studies: US Abdomen Limited RUQ (LIVER/GB)  Result Date: 05/09/2022 CLINICAL DATA:  Right upper quadrant pain, jaundice EXAM: ULTRASOUND ABDOMEN LIMITED RIGHT UPPER QUADRANT COMPARISON:  Same-day CT FINDINGS: Gallbladder: Sludge in the gallbladder. No discrete gallstones. No gallbladder wall thickening. No sonographic Murphy sign noted by sonographer. Common bile duct: Diameter: 0.5 cm Liver: No focal lesion identified. Within normal limits in parenchymal echogenicity. Portal vein is patent on color Doppler imaging with normal direction of blood flow towards the liver. Other: None. IMPRESSION: Gallbladder sludge without ultrasound evidence of acute cholecystitis. Consider nuclear scintigraphic HIDA scan to further evaluate if there is clinical concern for cholecystitis or biliary ductal obstruction. Electronically Signed   By: Delanna Ahmadi M.D.   On: 05/09/2022 17:52   CT ABDOMEN PELVIS WO CONTRAST  Result Date: 05/09/2022 CLINICAL DATA:  67 year old female with history of acute onset of abdominal pain. Left renal failure. Vomiting and diarrhea. EXAM: CT ABDOMEN AND PELVIS WITHOUT CONTRAST TECHNIQUE: Multidetector CT imaging of the abdomen and pelvis was performed following the standard protocol without IV contrast. RADIATION DOSE REDUCTION: This exam was performed according to the departmental dose-optimization program which includes automated exposure control, adjustment of the mA  and/or kV according to patient size and/or use of iterative reconstruction technique. COMPARISON:  CTA of the abdomen and pelvis 04/17/2016. FINDINGS: Lower chest: Atherosclerosis in the descending thoracic aorta as well as the right coronary artery. Hepatobiliary: No definite suspicious cystic or solid hepatic lesions are confidently identified on today's noncontrast CT examination. There is a small amount of amorphous intermediate attenuation material lying dependently in the gallbladder, likely to represent biliary sludge. Gallbladder is moderately distended. Gallbladder wall does not appear thickened. No pericholecystic fluid or surrounding inflammatory changes. Pancreas: No definite pancreatic mass or peripancreatic fluid collections or inflammatory changes are noted on today's noncontrast CT examination. Spleen: Unremarkable. Adrenals/Urinary Tract: In the anterior aspect of the lower pole the left kidney there is a 1.5 cm low-attenuation lesion, incompletely characterized on today's noncontrast CT examination,  but statistically likely to represent cysts (no imaging follow-up recommended). Right kidney and bilateral adrenal glands are otherwise normal in appearance. No hydroureteronephrosis. Urinary bladder is nearly completely decompressed, but otherwise unremarkable in appearance. Stomach/Bowel: The unenhanced appearance of the stomach is normal. No pathologic dilatation of small bowel or colon. Numerous colonic diverticuli are noted, without surrounding inflammatory changes to indicate an acute diverticulitis at this time. Normal appendix. Vascular/Lymphatic: Atherosclerotic calcifications are noted in the abdominal aorta. No lymphadenopathy noted in the abdomen or pelvis. Reproductive: Status post hysterectomy. Ovaries are not confidently identified may be surgically absent or atrophic. Other: No significant volume of ascites.  No pneumoperitoneum. Musculoskeletal: There are no aggressive appearing lytic  or blastic lesions noted in the visualized portions of the skeleton. IMPRESSION: 1. No acute findings are noted in the abdomen or pelvis to account for the patient's symptoms. 2. Small amount of biliary sludge lying dependently in the gallbladder. No imaging findings to suggest an acute cholecystitis at this time. 3. Colonic diverticulosis without evidence of acute diverticulitis at this time. 4. Aortic atherosclerosis. 5. Additional incidental findings, as above. Electronically Signed   By: Vinnie Langton M.D.   On: 05/09/2022 16:43      Raguel Kosloski T. Ladson  If 7PM-7AM, please contact night-coverage [email or web address] 05/10/2022, 10:46 AM

## 2022-05-10 NOTE — Progress Notes (Signed)
[facility] Progress Note  Name: Wendy Randolph MRN: [id number] DOB: [birth year]  Chief Complaint:  Weakness and n/v/d  Subjective:  She was admitted to [facility]. (67)  She had one unmeasured urine void charted.  (Both strict ins/outs and routine in/out orders were in place).  She has been on fluids - currently LR running at 150 ml/hr.     She shares that she was taking tylenol and ibuprofen every 8 hours for a stretch of time.  She feels like she has to urinate but can't.  She got on the toilet just now with assistance and had some bright red blood per rectum and minimal urine output but still feels like she needs to urinate.  The patient states that she did have some blood at home on the toilet paper, as well but not in the toilet.   Review of systems:  N/v as above Abd pain  Now with blood per rectum and had hemorrhoids noted per RN Feels urge to urinate but can't  Denies shortness of breath   -------------- Background on consult:  HPI: The patient is a 67 y.o. year-old w/ PMH as below who presented to ED for gen weakness, fatigue and N/V/ D for the last 10 days. Unable to take her prescription medications. No hx liver/ kidney problems. No sick contacts. In ED BP's were wnl, HR 90-110, RR 13, afebrile. Labs showed BUN 104, creat 13.8 (Cr on Mar 26, 2022 was 0.65). Also Tbili is 20.0 and AST 608, ALT 913, alk phos 770, alb 3.0.  glucose 103. Pt rec'd IV zofran, 2 L bolus NS and will be admitted. We are asked to see for renal failure.  In [facility] pt saw per PCP w/ [facility] on 03/26/22 for HTN on norvasc, GERD, Fe def anemia, mixed HL, pre diabetes, weight loss, acute R back pain and vit D deficiency.  She was taking a lot of cold syrups and pills, not sure about nsaids.  Had dizziness and lightheadedness which is better here since getting IVF's in ED.  No hx of kidney failure.     Intake/Output Summary (Last 24 hours) at 05/10/2022 0846 Last data filed at 05/10/2022 0500 Gross per 24  hour  Intake 1853.98 ml  Output --  Net 1853.98 ml    Vitals:  Vitals:   05/10/22 0300 05/10/22 0515 05/10/22 0721 05/10/22 0800  BP: (!) 143/61  (!) 144/59 (!) 150/60  Pulse: 91  83   Resp: (!) 22  17 14  $ Temp: 97.9 F (36.6 C)  97.9 F (36.6 C)   TempSrc: Oral  Axillary   SpO2: 95%     Weight:  62.4 kg    Height:         Physical Exam:  General adult female in bed in no acute distress HEENT normocephalic atraumatic extraocular movements intact sclera anicteric Neck supple trachea midline Lungs clear to auscultation bilaterally normal work of breathing at rest on room air Heart S1S2 no rub Abdomen soft nondistended; tender with some guarding Extremities no edema  Psych normal mood and affect Neuro alert and oriented x3 provides hx and follows commands  GU no foley in place; urinary urge & trying to urinate on my exam but can't  Medications reviewed   Labs:     Latest Ref Rng & Units 05/10/2022   12:38 AM 05/09/2022    1:34 PM 04/17/2016    7:40 PM  BMP  Glucose 70 - 99 mg/dL 77  103  118  BUN 8 - 23 mg/dL 107  104  12   Creatinine 0.44 - 1.00 mg/dL 14.63  13.89  0.98   Sodium 135 - 145 mmol/L 133  132  138   Potassium 3.5 - 5.1 mmol/L 4.3  4.2  3.4   Chloride 98 - 111 mmol/L 103  99  101   CO2 22 - 32 mmol/L 11  15  28   $ Calcium 8.9 - 10.3 mg/dL 8.4  8.9  9.8     Last creat 0.65 from 03/26/22 in [place], [place]     CT abd wo contrast - normal appearing kidneys bilat w/o any hydronephrosis   Assessment/Plan:   AKI - Secondary to prolonged pre-renal insults with n/v/d and prolonged viral illness in the setting of ARB as well as NSAID's.  BL Cr 0.65 from 03/26/22.  UA is unremarkable and CT abd shows no obstruction.  Thankfully transaminitis is improved slightly - hopefully renal function will follow.  She was hydrated as felt to be dry on exam - Continue fluids - given liver failure will discontinue LR.  NS 1 liter bolus now.  Start Bicarb gtt.     - place foley catheter  - ordered UA and up/cr ratio    Metabolic acidosis - stop LR and start bicarb gtt     Transaminitis, jaundice/ hyperbilirubinemia - transaminitis and hyperbilirubinemia are improving thankfully.  Hepatitis A, B and C non-reactive.  She has been taking tylenol.  Per primary team.  GI has been consulted  Nausea/vomiting/diarrhea - note GI has been consulted.  Supportive care per primary team   GI bleed - patient with blood per rectum on exam this AM.  RN noted hemorrhoids on exam.  Some dark blood noted, too.  GI is consulted.   HTN - no need for BP lowering meds at this time.  Avoiding arb/ ACEi's at this time given AKI.  Avoid hypotension   Normocytic Anemia - check iron panel. Note platelets normal.    Disposition - please continue inpatient monitoring - worsening AKI    Claudia Desanctis, MD 05/10/2022 9:33 AM

## 2022-05-10 NOTE — Progress Notes (Signed)
  Transition of Care [facility]) Screening Note   Patient Details  Name: Wendy Randolph Date of Birth: 02/26/1956   Transition of Care [facility]) CM/SW Contact:    Cyndi Bender, RN Phone Number: 05/10/2022, 2:18 PM    Transition of Care Department [facility]) has reviewed patient and no TOC needs have been identified at this time. We will continue to monitor patient advancement through interdisciplinary progression rounds. If new patient transition needs arise, please place a TOC consult.

## 2022-05-11 ENCOUNTER — Inpatient Hospital Stay (HOSPITAL_COMMUNITY): Payer: Medicare Other

## 2022-05-11 DIAGNOSIS — R197 Diarrhea, unspecified: Secondary | ICD-10-CM | POA: Diagnosis not present

## 2022-05-11 DIAGNOSIS — N179 Acute kidney failure, unspecified: Secondary | ICD-10-CM | POA: Diagnosis not present

## 2022-05-11 DIAGNOSIS — D696 Thrombocytopenia, unspecified: Secondary | ICD-10-CM

## 2022-05-11 DIAGNOSIS — R7989 Other specified abnormal findings of blood chemistry: Secondary | ICD-10-CM | POA: Diagnosis not present

## 2022-05-11 DIAGNOSIS — K922 Gastrointestinal hemorrhage, unspecified: Secondary | ICD-10-CM | POA: Diagnosis not present

## 2022-05-11 HISTORY — PX: IR FLUORO GUIDE CV LINE RIGHT: IMG2283

## 2022-05-11 HISTORY — PX: IR US GUIDE VASC ACCESS RIGHT: IMG2390

## 2022-05-11 LAB — COMPREHENSIVE METABOLIC PANEL
ALT: 522 U/L — ABNORMAL HIGH (ref 0–44)
AST: 195 U/L — ABNORMAL HIGH (ref 15–41)
Albumin: 2.2 g/dL — ABNORMAL LOW (ref 3.5–5.0)
Alkaline Phosphatase: 650 U/L — ABNORMAL HIGH (ref 38–126)
Anion gap: 18 — ABNORMAL HIGH (ref 5–15)
BUN: 107 mg/dL — ABNORMAL HIGH (ref 8–23)
CO2: 12 mmol/L — ABNORMAL LOW (ref 22–32)
Calcium: 9 mg/dL (ref 8.9–10.3)
Chloride: 103 mmol/L (ref 98–111)
Creatinine, Ser: 14.31 mg/dL — ABNORMAL HIGH (ref 0.44–1.00)
GFR, Estimated: 3 mL/min — ABNORMAL LOW (ref >60)
Glucose, Bld: 92 mg/dL (ref 70–99)
Potassium: 4 mmol/L (ref 3.5–5.1)
Sodium: 133 mmol/L — ABNORMAL LOW (ref 135–145)
Total Bilirubin: 5.3 mg/dL — ABNORMAL HIGH (ref 0.3–1.2)
Total Protein: 5.7 g/dL — ABNORMAL LOW (ref 6.5–8.1)

## 2022-05-11 LAB — GASTROINTESTINAL PANEL BY PCR, STOOL (REPLACES STOOL CULTURE)

## 2022-05-11 LAB — CBC
HCT: 26 % — ABNORMAL LOW (ref 36.0–46.0)
Hemoglobin: 8.7 g/dL — ABNORMAL LOW (ref 12.0–15.0)
MCH: 30.3 pg (ref 26.0–34.0)
MCHC: 33.5 g/dL (ref 30.0–36.0)
MCV: 90.6 fL (ref 80.0–100.0)
Platelets: 122 10*3/uL — ABNORMAL LOW (ref 150–400)
RBC: 2.87 MIL/uL — ABNORMAL LOW (ref 3.87–5.11)
RDW: 15.1 % (ref 11.5–15.5)
WBC: 15 10*3/uL — ABNORMAL HIGH (ref 4.0–10.5)
nRBC: 1.1 % — ABNORMAL HIGH (ref 0.0–0.2)

## 2022-05-11 LAB — IRON AND TIBC
Iron: 66 ug/dL (ref 28–170)
Saturation Ratios: 27 % (ref 10.4–31.8)
TIBC: 248 ug/dL — ABNORMAL LOW (ref 250–450)
UIBC: 182 ug/dL

## 2022-05-11 LAB — VITAMIN B12: Vitamin B-12: 5448 pg/mL — ABNORMAL HIGH (ref 180–914)

## 2022-05-11 LAB — HEPATITIS B SURFACE ANTIGEN: Hepatitis B Surface Ag: NONREACTIVE

## 2022-05-11 LAB — RETICULOCYTES
Immature Retic Fract: 44.3 % — ABNORMAL HIGH (ref 2.3–15.9)
RBC.: 2.82 MIL/uL — ABNORMAL LOW (ref 3.87–5.11)
Retic Count, Absolute: 59.8 10*3/uL (ref 19.0–186.0)
Retic Ct Pct: 2.1 % (ref 0.4–3.1)

## 2022-05-11 LAB — HEMOGLOBIN AND HEMATOCRIT, BLOOD
HCT: 28.6 % — ABNORMAL LOW (ref 36.0–46.0)
Hemoglobin: 9.6 g/dL — ABNORMAL LOW (ref 12.0–15.0)

## 2022-05-11 LAB — MAGNESIUM: Magnesium: 1.7 mg/dL (ref 1.7–2.4)

## 2022-05-11 LAB — CK: Total CK: 172 U/L (ref 38–234)

## 2022-05-11 LAB — FERRITIN: Ferritin: >7500 ng/mL — ABNORMAL HIGH (ref 11–307)

## 2022-05-11 LAB — FOLATE: Folate: 14.6 ng/mL (ref >5.9)

## 2022-05-11 LAB — PHOSPHORUS: Phosphorus: 5.7 mg/dL — ABNORMAL HIGH (ref 2.5–4.6)

## 2022-05-11 LAB — BILIRUBIN, DIRECT: Bilirubin, Direct: 2.2 mg/dL — ABNORMAL HIGH (ref 0.0–0.2)

## 2022-05-11 MED ORDER — FENTANYL CITRATE (PF) 100 MCG/2ML IJ SOLN
INTRAMUSCULAR | Status: AC | PRN
  Administered 2022-05-11: 25 ug via INTRAVENOUS

## 2022-05-11 MED ORDER — FENTANYL CITRATE (PF) 100 MCG/2ML IJ SOLN
INTRAMUSCULAR | Status: AC
  Filled 2022-05-11: qty 2

## 2022-05-11 MED ORDER — MIDAZOLAM HCL 2 MG/2ML IJ SOLN
INTRAMUSCULAR | Status: AC | PRN
  Administered 2022-05-11: 1 mg via INTRAVENOUS

## 2022-05-11 MED ORDER — AMLODIPINE BESYLATE 5 MG PO TABS
5.0000 mg | ORAL_TABLET | Freq: Every day | ORAL | Status: DC
  Administered 2022-05-11 – 2022-05-18 (×7): 5 mg via ORAL
  Filled 2022-05-11 (×7): qty 1

## 2022-05-11 MED ORDER — HEPARIN SODIUM (PORCINE) 1000 UNIT/ML IJ SOLN
INTRAMUSCULAR | Status: AC
  Administered 2022-05-11: 3200 [IU]
  Filled 2022-05-11: qty 10

## 2022-05-11 MED ORDER — LIDOCAINE-PRILOCAINE 2.5-2.5 % EX CREA
1.0000 | TOPICAL_CREAM | CUTANEOUS | Status: DC | PRN
  Filled 2022-05-11: qty 5

## 2022-05-11 MED ORDER — ALTEPLASE 2 MG IJ SOLR
2.0000 mg | Freq: Once | INTRAMUSCULAR | Status: DC | PRN
  Filled 2022-05-11: qty 2

## 2022-05-11 MED ORDER — ANTICOAGULANT SODIUM CITRATE 4% (200MG/5ML) IV SOLN
5.0000 mL | Status: DC | PRN
  Filled 2022-05-11: qty 5

## 2022-05-11 MED ORDER — CHLORHEXIDINE GLUCONATE CLOTH 2 % EX PADS: 6.0000 | MEDICATED_PAD | Freq: Every day | CUTANEOUS | Status: DC

## 2022-05-11 MED ORDER — LOPERAMIDE HCL 2 MG PO CAPS
2.0000 mg | ORAL_CAPSULE | ORAL | Status: DC | PRN
  Administered 2022-05-11 – 2022-05-12 (×3): 2 mg via ORAL
  Administered 2022-05-12: 4 mg via ORAL
  Administered 2022-05-13: 2 mg via ORAL
  Filled 2022-05-11 (×5): qty 1

## 2022-05-11 MED ORDER — LIDOCAINE HCL (PF) 1 % IJ SOLN
5.0000 mL | INTRAMUSCULAR | Status: DC | PRN
  Filled 2022-05-11: qty 5

## 2022-05-11 MED ORDER — MIDAZOLAM HCL 2 MG/2ML IJ SOLN
INTRAMUSCULAR | Status: AC
  Filled 2022-05-11: qty 2

## 2022-05-11 MED ORDER — CEFAZOLIN SODIUM-DEXTROSE 2-4 GM/100ML-% IV SOLN
INTRAVENOUS | Status: AC
  Filled 2022-05-11: qty 100

## 2022-05-11 MED ORDER — SODIUM CHLORIDE 0.9 % IV SOLN
20.0000 ug | Freq: Once | INTRAVENOUS | Status: AC
  Administered 2022-05-11: 20 ug via INTRAVENOUS
  Filled 2022-05-11: qty 5

## 2022-05-11 MED ORDER — CHLORHEXIDINE GLUCONATE CLOTH 2 % EX PADS
6.0000 | MEDICATED_PAD | Freq: Every day | CUTANEOUS | Status: DC
  Administered 2022-05-12 – 2022-05-20 (×9): 6 via TOPICAL

## 2022-05-11 MED ORDER — PENTAFLUOROPROP-TETRAFLUOROETH EX AERO
1.0000 | INHALATION_SPRAY | CUTANEOUS | Status: DC | PRN
  Filled 2022-05-11: qty 116

## 2022-05-11 MED ORDER — LIDOCAINE-EPINEPHRINE 1 %-1:100000 IJ SOLN
INTRAMUSCULAR | Status: AC
  Administered 2022-05-11: 13 mL via INTRADERMAL
  Filled 2022-05-11: qty 1

## 2022-05-11 MED ORDER — HEPARIN SODIUM (PORCINE) 1000 UNIT/ML DIALYSIS
1000.0000 [IU] | INTRAMUSCULAR | Status: DC | PRN
  Administered 2022-05-11: 3200 [IU]
  Filled 2022-05-11 (×3): qty 1

## 2022-05-11 MED ORDER — CEFAZOLIN SODIUM-DEXTROSE 2-4 GM/100ML-% IV SOLN
INTRAVENOUS | Status: AC | PRN
  Administered 2022-05-11: 2 g via INTRAVENOUS

## 2022-05-11 MED ORDER — CEFAZOLIN SODIUM-DEXTROSE 2-4 GM/100ML-% IV SOLN: 2.0000 g | INTRAVENOUS | Status: AC

## 2022-05-11 NOTE — Consult Note (Signed)
Chief Complaint: Patient was seen in consultation today for tunneled dialysis catheter placement Chief Complaint  Patient presents with   Emesis   at the request of Dr Katheren Puller  Supervising Physician: Aletta Edouard  Patient Status: [facility] - In-pt  History of Present Illness: Wendy Randolph is a 67 y.o. female   Pt has been seen by Nephrology Acute kidney injury Anuric  Nephrology note this am:  AKI - anuric  - Secondary to prolonged pre-renal insults with n/v/d and prolonged viral illness in the setting of ARB as well as NSAID's.  BL Cr 0.65 from 03/26/22.  UA is unremarkable and CT abd shows no obstruction.  Thankfully transaminitis is improved slightly - hopefully renal function will follow.  She was hydrated as felt to be dry on exam. UA with large blood but no RBC; CK was mildly elevated and has now normalized Consult IR for tunneled HD catheter placement   Pt is scheduled for tunneled dialysis catheter placement in IR today  Past Medical History:  Diagnosis Date   High cholesterol    Hypertension    Perirectal abscess 04/17/2016    Past Surgical History:  Procedure Laterality Date   ABDOMINAL HYSTERECTOMY     INCISION AND DRAINAGE PERIRECTAL ABSCESS N/A 04/18/2016   Procedure: IRRIGATION AND DEBRIDEMENT PERIRECTAL ABSCESS;  Surgeon: Excell Seltzer, MD;  Location: [facility];  Service: General;  Laterality: N/A;    Allergies: Patient has no known allergies.  Medications: Prior to Admission medications   Medication Sig Start Date End Date Taking? Authorizing Provider  amLODipine (NORVASC) 5 MG tablet Take 5 mg by mouth daily. 03/29/22  Yes [provider]  Ferrous Sulfate (IRON PO) Take 1 tablet by mouth daily.   Yes [provider]  ibuprofen (ADVIL) 200 MG tablet Take 200-600 mg by mouth every 6 (six) hours as needed for mild pain.   Yes [provider]  latanoprost (XALATAN) 0.005 % ophthalmic solution Place 1 drop into both eyes at  bedtime. 05/02/22  Yes [provider]  losartan (COZAAR) 50 MG tablet Take 50 mg by mouth daily.  09/26/15 05/10/22 Yes [provider]  pantoprazole (PROTONIX) 40 MG tablet Take 40 mg by mouth daily. 03/04/22  Yes [provider]  rosuvastatin (CRESTOR) 40 MG tablet Take 40 mg by mouth daily. 12/06/21  Yes [provider]  VITAMIN D PO Take 1 tablet by mouth daily.   Yes [provider]     History reviewed. No pertinent family history.  Social History   Socioeconomic History   Marital status: Single    Spouse name: Not on file   Number of children: Not on file   Years of education: Not on file   Highest education level: Not on file  Occupational History   Not on file  Tobacco Use   Smoking status: Every Day    Packs/day: 0.25    Types: Cigarettes   Smokeless tobacco: Never  Substance and Sexual Activity   Alcohol use: Yes    Alcohol/week: 3.0 standard drinks of alcohol    Types: 3 Shots of liquor per week   Drug use: Not Currently    Comment: Socially   Sexual activity: Not on file  Other Topics Concern   Not on file  Social History Narrative   Not on file   Social Determinants of Health   Financial Resource Strain: Not on file  Food Insecurity: Not on file  Transportation Needs: Not on file  Physical  Activity: Not on file  Stress: Not on file  Social Connections: Not on file    Review of Systems: A 12 point ROS discussed and pertinent positives are indicated in the HPI above.  All other systems are negative.    Vital Signs: BP (!) 149/65 (BP Location: Left Arm)   Pulse 86   Temp 97.9 F (36.6 C) (Oral)   Resp 19   Ht 5' 6"$  (1.676 m)   Wt 141 lb 8.6 oz (64.2 kg)   SpO2 97%   BMI 22.84 kg/m     Physical Exam Vitals reviewed.  HENT:     Mouth/Throat:     Mouth: Mucous membranes are moist.  Cardiovascular:     Rate and Rhythm: Normal rate and regular rhythm.     Heart sounds: Normal heart sounds.   Pulmonary:     Breath sounds: Normal breath sounds. No wheezing.  Abdominal:     Palpations: Abdomen is soft.  Musculoskeletal:        General: Normal range of motion.  Skin:    General: Skin is warm.  Neurological:     Mental Status: She is alert and oriented to person, place, and time.  Psychiatric:        Behavior: Behavior normal.     Imaging: US Abdomen Limited RUQ (LIVER/GB)  Result Date: 05/09/2022 CLINICAL DATA:  Right upper quadrant pain, jaundice EXAM: ULTRASOUND ABDOMEN LIMITED RIGHT UPPER QUADRANT COMPARISON:  Same-day CT FINDINGS: Gallbladder: Sludge in the gallbladder. No discrete gallstones. No gallbladder wall thickening. No sonographic Murphy sign noted by sonographer. Common bile duct: Diameter: 0.5 cm Liver: No focal lesion identified. Within normal limits in parenchymal echogenicity. Portal vein is patent on color Doppler imaging with normal direction of blood flow towards the liver. Other: None. IMPRESSION: Gallbladder sludge without ultrasound evidence of acute cholecystitis. Consider nuclear scintigraphic HIDA scan to further evaluate if there is clinical concern for cholecystitis or biliary ductal obstruction. Electronically Signed   By: Delanna Ahmadi M.D.   On: 05/09/2022 17:52   CT ABDOMEN PELVIS WO CONTRAST  Result Date: 05/09/2022 CLINICAL DATA:  67 year old female with history of acute onset of abdominal pain. Left renal failure. Vomiting and diarrhea. EXAM: CT ABDOMEN AND PELVIS WITHOUT CONTRAST TECHNIQUE: Multidetector CT imaging of the abdomen and pelvis was performed following the standard protocol without IV contrast. RADIATION DOSE REDUCTION: This exam was performed according to the departmental dose-optimization program which includes automated exposure control, adjustment of the mA and/or kV according to patient size and/or use of iterative reconstruction technique. COMPARISON:  CTA of the abdomen and pelvis 04/17/2016. FINDINGS: Lower chest:  Atherosclerosis in the descending thoracic aorta as well as the right coronary artery. Hepatobiliary: No definite suspicious cystic or solid hepatic lesions are confidently identified on today's noncontrast CT examination. There is a small amount of amorphous intermediate attenuation material lying dependently in the gallbladder, likely to represent biliary sludge. Gallbladder is moderately distended. Gallbladder wall does not appear thickened. No pericholecystic fluid or surrounding inflammatory changes. Pancreas: No definite pancreatic mass or peripancreatic fluid collections or inflammatory changes are noted on today's noncontrast CT examination. Spleen: Unremarkable. Adrenals/Urinary Tract: In the anterior aspect of the lower pole the left kidney there is a 1.5 cm low-attenuation lesion, incompletely characterized on today's noncontrast CT examination, but statistically likely to represent cysts (no imaging follow-up recommended). Right kidney and bilateral adrenal glands are otherwise normal in appearance. No hydroureteronephrosis. Urinary bladder is nearly completely decompressed, but otherwise unremarkable in appearance.  Stomach/Bowel: The unenhanced appearance of the stomach is normal. No pathologic dilatation of small bowel or colon. Numerous colonic diverticuli are noted, without surrounding inflammatory changes to indicate an acute diverticulitis at this time. Normal appendix. Vascular/Lymphatic: Atherosclerotic calcifications are noted in the abdominal aorta. No lymphadenopathy noted in the abdomen or pelvis. Reproductive: Status post hysterectomy. Ovaries are not confidently identified may be surgically absent or atrophic. Other: No significant volume of ascites.  No pneumoperitoneum. Musculoskeletal: There are no aggressive appearing lytic or blastic lesions noted in the visualized portions of the skeleton. IMPRESSION: 1. No acute findings are noted in the abdomen or pelvis to account for the patient's  symptoms. 2. Small amount of biliary sludge lying dependently in the gallbladder. No imaging findings to suggest an acute cholecystitis at this time. 3. Colonic diverticulosis without evidence of acute diverticulitis at this time. 4. Aortic atherosclerosis. 5. Additional incidental findings, as above. Electronically Signed   By: Vinnie Langton M.D.   On: 05/09/2022 16:43   DG Lumbar Spine Complete  Result Date: 04/12/2022 CLINICAL DATA:  Low back pain over the last 2 weeks EXAM: LUMBAR SPINE - COMPLETE 4+ VIEW COMPARISON:  None Available. FINDINGS: No malalignment. No regional fracture. No disc space narrowing. Ordinary endplate osteophytes in the lower lumbar spine. No significant facet arthropathy. No pars defect. Aortic atherosclerosis incidentally noted. 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 IMPRESSION: No acute or traumatic finding. Ordinary endplate osteophytes in the lower lumbar spine. Some left SI joint arthritis. Electronically Signed   By: Nelson Chimes M.D.   On: 04/12/2022 11:12    Labs:  CBC: Recent Labs    05/09/22 1334 05/10/22 0038 05/10/22 1413 05/11/22 0035 05/11/22 0522  WBC 14.5* 15.9* 15.8* 15.0*  --   HGB 11.1* 9.0* 10.7* 8.7* 9.6*  HCT 32.2* 27.8* 32.8* 26.0* 28.6*  PLT 211 167 115* 122*  --     COAGS: Recent Labs    05/10/22 0038  INR 1.0    BMP: Recent Labs    05/09/22 1334 05/10/22 0038 05/11/22 0035  NA 132* 133* 133*  K 4.2 4.3 4.0  CL 99 103 103  CO2 15* 11* 12*  GLUCOSE 103* 77 92  BUN 104* 107* 107*  CALCIUM 8.9 8.4* 9.0  CREATININE 13.89* 14.63* 14.31*  GFRNONAA 3* 2* 3*    LIVER FUNCTION TESTS: Recent Labs    05/09/22 1334 05/10/22 0038 05/11/22 0035  BILITOT 20.0* 14.9* 5.3*  AST 608* 376* 195*  ALT 913* 710* 522*  ALKPHOS 770* 664* 650*  PROT 7.1 5.7* 5.7*  ALBUMIN 3.0* 2.4* 2.2*    TUMOR MARKERS: No results for input(s): "AFPTM", "CEA", "CA199", "CHROMGRNA" in the last 8760  hours.  Assessment and Plan:  Acute kidney injury Anuric Nephrology to start dialysis tomorrow Afeb; wbc 15 Cdiff neg per MD and "low suspicion of bacteremia". Scheduled in IR for tunneled dialysis catheter placement in IR today Risks and benefits discussed with the patient including, but not limited to bleeding, infection, vascular injury, pneumothorax which may require chest tube placement, air embolism or even death  All of the patient's questions were answered, patient is agreeable to proceed. Consent signed and in chart.  Thank you for this interesting consult.  I greatly enjoyed meeting Wendy Randolph and look forward to participating in their care.  A copy of this report was sent to the requesting provider on this date.  Electronically Signed: Lavonia Drafts, PA-C 05/11/2022, 9:30 AM   I  spent a total of 20 Minutes    in face to face in clinical consultation, greater than 50% of which was counseling/coordinating care for tunneled dialysis catheter placement

## 2022-05-11 NOTE — Progress Notes (Signed)
PROGRESS NOTE  Kaylianna Lango [id number] DOB: 1956/01/21   PCP: Patient, No Pcp Per  Patient is from: Home.  Lives with his daughter.  Independently ambulates at baseline.  DOA: 05/09/2022 LOS: 2  Chief complaints Chief Complaint  Patient presents with   Emesis     Brief Narrative / Interim history: 67 year old F with PMH of HTN, HLD and tobacco use disorder presenting with nausea, vomiting and diarrhea for about a week and admitted for severe acute renal failure, elevated liver enzymes with markedly elevated bilirubin and lipase likely due to dehydration from GI loss in the setting of possible viral illness with concurrent NSAID use and ARB.  In ED, she had a stable vitals. Cr 14.  BUN 104.  Bicarb 15.  AST 608.  ALT 913.  ALP 770.  Total bili 20.  Direct bili 16.2.  Lipase 1118.  CT abdomen and pelvis without acute finding.  RUQ US demonstrated gallbladder sludge with CBD measuring 0.5 cm.  Patient was started on IV fluid and transferred to [facility] for further care after consultation with GI and nephrology.  [place] placed on 2/20.  HD started.  LFT improved.  Ongoing diarrhea.  Inflammatory markers elevated.  GI following.  Subjective: Seen and examined earlier this morning.  No major events overnight of this morning.  No complaints other than ongoing diarrhea.  She reports about 3-4 watery bowel movements overnight.  Denies chest pain, dyspnea, abdominal pain, nausea or vomiting.  Objective: Vitals:   05/11/22 1505 05/11/22 1518 05/11/22 1530 05/11/22 1600  BP: (!) 141/63  (!) 151/67 121/67  Pulse: 93 86 95 (!) 101  Resp: 16   16  Temp: 98.3 F (36.8 C)     TempSrc: Oral     SpO2: 100% 95% 97% 97%  Weight: 68.6 kg     Height:        Examination:  GENERAL: No apparent distress.  Nontoxic. HEENT: MMM.  Vision and hearing grossly intact.  NECK: Supple.  No apparent JVD.  RESP:  No IWOB.  Fair aeration bilaterally. CVS:  RRR. Heart sounds normal.  ABD/GI/GU: BS+. Abd soft,  NTND.  MSK/EXT:   No apparent deformity. Moves extremities. No edema.  SKIN: no apparent skin lesion or wound NEURO: Awake and alert. Oriented appropriately.  No apparent focal neuro deficit. PSYCH: Calm. Normal affect.   Procedures:  None  Microbiology summarized: MRSA PCR screen and reactive. C. difficile negative for active infection GIP nonreactive Blood culture pending  Assessment and plan: Principal Problem:   Acute renal failure (HCC) Active Problems:   Elevated LFTs   Hyperbilirubinemia   Essential hypertension   Mixed hyperlipidemia   Infectious diarrhea   Elevated lipase   High anion gap metabolic acidosis   Bandemia   Acute GI bleeding  Acute renal failure/high anion gap metabolic acidosis/hyponatremia: Baseline Cr 0.65.  AKI likely prerenal insult from dehydration due to GI loss: Concurrent use of NSAID and ARB.  CT A/P without obstruction.  UA without significant finding. Recent Labs    05/09/22 1334 05/10/22 0038 05/11/22 0035  BUN 104* 107* 107*  CREATININE 13.89* 14.63* 14.31*  -TDC placed and HD started on 2/20. -Continue p.o. sodium bicarbonate -Continue Foley catheter. -Monitor urine output closely -Avoid nephrotoxic meds. -Nephrology following.  Autoimmune workup initiated.   Elevated liver enzymes/hyperbilirubinemia: Seems to be improving.  CT abdomen and pelvis and RUQ [place] without significant finding other than some biliary sludge.  Acute hepatitis panel and HIV nonreactive.  LFT improved.  Recent Labs  Lab 05/09/22 1334 05/10/22 0038 05/11/22 0035  AST 608* 376* 195*  ALT 913* 710* 522*  ALKPHOS 770* 664* 650*  BILITOT 20.0* 14.9* 5.3*  PROT 7.1 5.7* 5.7*  ALBUMIN 3.0* 2.4* 2.2*  -Avoid hepatotoxic meds -Continue monitoring -GI following.   Elevated lipase: No radiologic evidence of pancreatitis or biliary stone.  Denies drinking alcohol. -Continue monitoring   Infectious diarrhea: Improved.  Tenderness improved as well.  C.  difficile testing and GIP negative.  Inflammatory markers elevated. -Follow stool chemistry and calprotectin. -Discontinue enteric precaution -GI following.  Essential hypertension: BP slightly  -Resume home amlodipine -Continue IV labetalol as needed -Continue holding losartan -HD today.  GI bleed/normocytic anemia: RN staff noted some darker red stool this morning.  Drop in Hgb likely dilutional Recent Labs    05/09/22 1334 05/10/22 0038 05/10/22 1413 05/11/22 0035 05/11/22 0522  HGB 11.1* 9.0* 10.7* 8.7* 9.6*  -Monitor H&H.  Mixed hyperlipidemia -Hold statin due to elevated liver enzymes  Leukocytosis/bandemia: No fever.  Hemodynamically stable. -Check blood culture to rule out bacteremia although low suspicion -Continue monitoring  Thrombocytopenia: Stable. -Recheck in the morning  Body mass index is 24.41 kg/m.           DVT prophylaxis:  Place and maintain sequential compression device Start: 05/10/22 0934 SCDs Start: 05/09/22 2209  Code Status: Full code Family Communication: None at bedside Level of care: Progressive Status is: Inpatient Remains inpatient appropriate because: AKI, elevated liver enzymes and ongoing diarrhea   Final disposition: Likely home once medically stable Consultants:  Nephrology Gastroenterology IR.  55 minutes with more than 50% spent in reviewing records, counseling patient/family and coordinating care.   Sch Meds:  Scheduled Meds:  amLODipine  5 mg Oral Daily   [START ON 05/12/2022] Chlorhexidine Gluconate Cloth  6 each Topical Q0600   Gerhardt's butt cream   Topical QID   Continuous Infusions:  anticoagulant sodium citrate      ceFAZolin (ANCEF) IV     PRN Meds:.alteplase, anticoagulant sodium citrate, heparin, labetalol, lidocaine (PF), lidocaine-prilocaine, loperamide, naphazoline-glycerin, ondansetron **OR** ondansetron (ZOFRAN) IV, pentafluoroprop-tetrafluoroeth  Antimicrobials: Anti-infectives (From  admission, onward)    Start     Dose/Rate Route Frequency Ordered Stop   05/11/22 1250  ceFAZolin (ANCEF) IVPB 2g/100 mL premix        over 30 Minutes Intravenous Continuous PRN 05/11/22 1256 05/11/22 1250   05/11/22 1200  ceFAZolin (ANCEF) IVPB 2g/100 mL premix        2 g 200 mL/hr over 30 Minutes Intravenous To Radiology 05/11/22 1106 05/12/22 1200        I have personally reviewed the following labs and images: CBC: Recent Labs  Lab 05/09/22 1334 05/10/22 0038 05/10/22 1413 05/11/22 0035 05/11/22 0522  WBC 14.5* 15.9* 15.8* 15.0*  --   NEUTROABS 10.3* 12.1*  --   --   --   HGB 11.1* 9.0* 10.7* 8.7* 9.6*  HCT 32.2* 27.8* 32.8* 26.0* 28.6*  MCV 89.0 92.7 92.7 90.6  --   PLT 211 167 115* 122*  --    BMP &GFR Recent Labs  Lab 05/09/22 1334 05/10/22 0038 05/11/22 0035  NA 132* 133* 133*  K 4.2 4.3 4.0  CL 99 103 103  CO2 15* 11* 12*  GLUCOSE 103* 77 92  BUN 104* 107* 107*  CREATININE 13.89* 14.63* 14.31*  CALCIUM 8.9 8.4* 9.0  MG  --  1.7 1.7  PHOS  --   --  5.7*   Estimated  Creatinine Clearance: 3.6 mL/min (A) (by C-G formula based on SCr of 14.31 mg/dL (H)). Liver & Pancreas: Recent Labs  Lab 05/09/22 1334 05/10/22 0038 05/11/22 0035  AST 608* 376* 195*  ALT 913* 710* 522*  ALKPHOS 770* 664* 650*  BILITOT 20.0* 14.9* 5.3*  PROT 7.1 5.7* 5.7*  ALBUMIN 3.0* 2.4* 2.2*   Recent Labs  Lab 05/09/22 1334  LIPASE 1,118*   No results for input(s): "AMMONIA" in the last 168 hours. Diabetic: Recent Labs    05/10/22 0038  HGBA1C 5.0   No results for input(s): "GLUCAP" in the last 168 hours. Cardiac Enzymes: Recent Labs  Lab 05/10/22 1413 05/11/22 0035  CKTOTAL 241* 172   No results for input(s): "PROBNP" in the last 8760 hours. Coagulation Profile: Recent Labs  Lab 05/10/22 0038  INR 1.0   Thyroid Function Tests: Recent Labs    05/10/22 0038  TSH 0.509   Lipid Profile: No results for input(s): "CHOL", "HDL", "LDLCALC", "TRIG",  "CHOLHDL", "LDLDIRECT" in the last 72 hours. Anemia Panel: Recent Labs    05/10/22 0020 05/11/22 0035  VITAMINB12  --  5,448*  FOLATE  --  14.6  FERRITIN >7,500* >7,500*  TIBC 245* 248*  IRON 89 66  RETICCTPCT  --  2.1   Urine analysis:    Component Value Date/Time   COLORURINE YELLOW 05/10/2022 1111   APPEARANCEUR CLEAR 05/10/2022 1111   LABSPEC 1.020 05/10/2022 1111   PHURINE 5.5 05/10/2022 1111   GLUCOSEU 100 (A) 05/10/2022 1111   HGBUR LARGE (A) 05/10/2022 1111   BILIRUBINUR MODERATE (A) 05/10/2022 1111   KETONESUR NEGATIVE 05/10/2022 1111   PROTEINUR 100 (A) 05/10/2022 1111   NITRITE NEGATIVE 05/10/2022 1111   LEUKOCYTESUR NEGATIVE 05/10/2022 1111   Sepsis Labs: Invalid input(s): "PROCALCITONIN", "LACTICIDVEN"  Microbiology: Recent Results (from the past 240 hour(s))  MRSA Next Gen by PCR, Nasal     Status: None   Collection Time: 05/09/22  8:15 PM   Specimen: Nasal Mucosa; Nasal Swab  Result Value Ref Range Status   MRSA by PCR Next Gen NOT DETECTED NOT DETECTED Final    Comment: (NOTE) The GeneXpert MRSA Assay (FDA approved for NASAL specimens only), is one component of a comprehensive MRSA colonization surveillance program. It is not intended to diagnose MRSA infection nor to guide or monitor treatment for MRSA infections. Test performance is not FDA approved in patients less than 9 years old. Performed at [facility], [place] [address], [place], [place] [address]   C Difficile Quick Screen w PCR reflex     Status: Abnormal   Collection Time: 05/09/22  9:50 PM   Specimen: Per Rectum; Stool  Result Value Ref Range Status   C Diff antigen POSITIVE (A) NEGATIVE Final   C Diff toxin NEGATIVE NEGATIVE Final   C Diff interpretation Results are indeterminate. See PCR results.  Final    Comment: Performed at [facility], [place] [address], [place], [place] [address]  Gastrointestinal Panel by PCR , Stool     Status: None   Collection Time: 05/09/22   9:50 PM   Specimen: Per Rectum; Stool  Result Value Ref Range Status   Campylobacter species NOT DETECTED NOT DETECTED Final   Plesimonas shigelloides NOT DETECTED NOT DETECTED Final   Salmonella species NOT DETECTED NOT DETECTED Final   Yersinia enterocolitica NOT DETECTED NOT DETECTED Final   Vibrio species NOT DETECTED NOT DETECTED Final   Vibrio cholerae NOT DETECTED NOT DETECTED Final  Enteroaggregative E coli (EAEC) NOT DETECTED NOT DETECTED Final   Enteropathogenic E coli (EPEC) NOT DETECTED NOT DETECTED Final   Enterotoxigenic E coli (ETEC) NOT DETECTED NOT DETECTED Final   Shiga like toxin producing E coli (STEC) NOT DETECTED NOT DETECTED Final   Shigella/Enteroinvasive E coli (EIEC) NOT DETECTED NOT DETECTED Final   Cryptosporidium NOT DETECTED NOT DETECTED Final   Cyclospora cayetanensis NOT DETECTED NOT DETECTED Final   Entamoeba histolytica NOT DETECTED NOT DETECTED Final   Giardia lamblia NOT DETECTED NOT DETECTED Final   Adenovirus F40/41 NOT DETECTED NOT DETECTED Final   Astrovirus NOT DETECTED NOT DETECTED Final   Norovirus GI/GII NOT DETECTED NOT DETECTED Final   Rotavirus A NOT DETECTED NOT DETECTED Final   Sapovirus (I, II, IV, and V) NOT DETECTED NOT DETECTED Final    Comment: Performed at [facility], [address], [place], [place] [address]  C. Diff by PCR, Reflexed     Status: None   Collection Time: 05/09/22  9:50 PM  Result Value Ref Range Status   Toxigenic C. Difficile by PCR NEGATIVE NEGATIVE Final    Comment: Patient is colonized with non toxigenic C. difficile. May not need treatment unless significant symptoms are present. Performed at [facility], [place] [address] [place], [place] [address]     Radiology Studies: IR Fluoro Guide CV Line Right  Result Date: 05/11/2022 INDICATION: 69 year old with acute kidney injury.  Patient needs hemodialysis. EXAM: FLUOROSCOPIC AND ULTRASOUND GUIDED PLACEMENT OF A TUNNELED DIALYSIS CATHETER  Physician: Stephan Minister. Anselm Pancoast, MD MEDICATIONS: Ancef 2 g; The antibiotic was administered within an appropriate time interval prior to skin puncture. ANESTHESIA/SEDATION: Moderate (conscious) sedation was employed during this procedure. A total of Versed 82m and fentanyl 25 mcg was administered intravenously at the order of the provider performing the procedure. Total intra-service moderate sedation time: 17 minutes. Patient's level of consciousness and vital signs were monitored continuously by radiology nurse throughout the procedure under the supervision of the provider performing the procedure. FLUOROSCOPY TIME:  Radiation Exposure Index (as provided by the fluoroscopic device): 1 mGy Kerma COMPLICATIONS: None immediate. PROCEDURE: The procedure was explained to the patient. The risks and benefits of the procedure were discussed and the patient's questions were addressed. Informed consent was obtained from the patient. The patient was placed supine on the interventional table. Ultrasound confirmed a patent right internal jugular vein. Ultrasound image obtained for documentation. The right neck and chest was prepped and draped in a sterile fashion. Maximal barrier sterile technique was utilized including caps, mask, sterile gowns, sterile gloves, sterile drape, hand hygiene and skin antiseptic. The right neck was anesthetized with 1% lidocaine. A small incision was made with #11 blade scalpel. A 21 gauge needle directed into the right internal jugular vein with ultrasound guidance. A micropuncture dilator set was placed. A 19 cm tip to cuff Palindrome catheter was selected. The skin below the right clavicle was anesthetized and a small incision was made with an #11 blade scalpel. A subcutaneous tunnel was formed to the vein dermatotomy site. The catheter was brought through the tunnel. The vein dermatotomy site was dilated to accommodate a peel-away sheath. The catheter was placed through the peel-away sheath and  directed into the central venous structures. The tip of the catheter was placed at superior cavoatrial junction with fluoroscopy. Fluoroscopic images were obtained for documentation. Both lumens were found to aspirate and flush well. The proper amount of heparin was flushed in both lumens. The vein  dermatotomy site was closed using a single layer of absorbable suture and Dermabond. The catheter was secured to the skin using Prolene suture. IMPRESSION: Successful placement of a right jugular tunneled dialysis catheter using ultrasound and fluoroscopic guidance. Electronically Signed   By: Markus Daft M.D.   On: 05/11/2022 13:53   IR US Guide Vasc Access Right  Result Date: 05/11/2022 INDICATION: 83 year old with acute kidney injury.  Patient needs hemodialysis. EXAM: FLUOROSCOPIC AND ULTRASOUND GUIDED PLACEMENT OF A TUNNELED DIALYSIS CATHETER Physician: Stephan Minister. Anselm Pancoast, MD MEDICATIONS: Ancef 2 g; The antibiotic was administered within an appropriate time interval prior to skin puncture. ANESTHESIA/SEDATION: Moderate (conscious) sedation was employed during this procedure. A total of Versed 24m and fentanyl 25 mcg was administered intravenously at the order of the provider performing the procedure. Total intra-service moderate sedation time: 17 minutes. Patient's level of consciousness and vital signs were monitored continuously by radiology nurse throughout the procedure under the supervision of the provider performing the procedure. FLUOROSCOPY TIME:  Radiation Exposure Index (as provided by the fluoroscopic device): 1 mGy Kerma COMPLICATIONS: None immediate. PROCEDURE: The procedure was explained to the patient. The risks and benefits of the procedure were discussed and the patient's questions were addressed. Informed consent was obtained from the patient. The patient was placed supine on the interventional table. Ultrasound confirmed a patent right internal jugular vein. Ultrasound image obtained for  documentation. The right neck and chest was prepped and draped in a sterile fashion. Maximal barrier sterile technique was utilized including caps, mask, sterile gowns, sterile gloves, sterile drape, hand hygiene and skin antiseptic. The right neck was anesthetized with 1% lidocaine. A small incision was made with #11 blade scalpel. A 21 gauge needle directed into the right internal jugular vein with ultrasound guidance. A micropuncture dilator set was placed. A 19 cm tip to cuff Palindrome catheter was selected. The skin below the right clavicle was anesthetized and a small incision was made with an #11 blade scalpel. A subcutaneous tunnel was formed to the vein dermatotomy site. The catheter was brought through the tunnel. The vein dermatotomy site was dilated to accommodate a peel-away sheath. The catheter was placed through the peel-away sheath and directed into the central venous structures. The tip of the catheter was placed at superior cavoatrial junction with fluoroscopy. Fluoroscopic images were obtained for documentation. Both lumens were found to aspirate and flush well. The proper amount of heparin was flushed in both lumens. The vein dermatotomy site was closed using a single layer of absorbable suture and Dermabond. The catheter was secured to the skin using Prolene suture. IMPRESSION: Successful placement of a right jugular tunneled dialysis catheter using ultrasound and fluoroscopic guidance. Electronically Signed   By: AMarkus DaftM.D.   On: 05/11/2022 13:53      Markham Dumlao T. [place] If 7PM-7AM, please contact night-coverage [email or web address] 05/11/2022, 4:22 PM

## 2022-05-11 NOTE — Progress Notes (Signed)
Received patient in bed to unit.  Alert and oriented.  Informed consent signed and in chart.   [place] duration:2  Patient tolerated well.  Transported back to the room  Alert, without acute distress.  Hand-off given to patient's nurse.   Access used: right [facility] Access issues: none  Total UF removed: 0 Medication(s) given: none    05/11/22 1721  Vitals  Temp 98.1 F (36.7 C)  Temp Source Oral  BP (!) 147/74  MAP (mmHg) 93  BP Location Left Arm  BP Method Automatic  Patient Position (if appropriate) Lying  Pulse Rate 99  Pulse Rate Source Monitor  ECG Heart Rate 99  Resp 14  Oxygen Therapy  SpO2 98 %  O2 Device Room Air  During Treatment Monitoring  Blood Flow Rate (mL/min) 200 mL/min  Arterial Pressure (mmHg) -60 mmHg  Venous Pressure (mmHg) 80 mmHg  TMP (mmHg) 14 mmHg  Ultrafiltration Rate (mL/min) 300 mL/min  Dialysate Flow Rate (mL/min) 300 ml/min  HD Safety Checks Performed Yes  Intra-Hemodialysis Comments Tx completed  Dialysis Fluid Bolus Normal Saline  Bolus Amount (mL) 300 mL  Post Treatment  Dialyzer Clearance Lightly streaked  Duration of HD Treatment -hour(s) 2 hour(s)  Liters Processed 24  Fluid Removed (mL) 0 mL  Tolerated HD Treatment Yes    Lancelot Alyea S [facility]

## 2022-05-11 NOTE — Procedures (Signed)
Interventional Radiology Procedure:   Indications: Acute kidney injury  Procedure: Tunneled dialysis catheter placement  Findings: Right jugular 19 cm Palindrome, tip at SVC/RA junction.  Complications: No immediate complications noted.     EBL: Minimal  Plan: Dialysis catheter is ready to use.    Yarah Fuente R. Anselm Pancoast, MD  Pager: [phone]

## 2022-05-11 NOTE — Progress Notes (Signed)
IR paged in [place]. Also spoke to [employer] paged Dr. Lucita Lora. Also notified RN Nate from [facility] notified of what is going on. pt catheter starting to leaking blood, we placed gauze and Tegaderm placed over dressing. Bleeding stopped after intervention.  Pt cleaned up and gown changed.

## 2022-05-11 NOTE — Progress Notes (Addendum)
Daily Progress Note  Hospital Day: 3  Chief Complaint:  diarrhea, elevated liver tests and elevated lipase.    Assessment   Brief Narrative:  Wendy Randolph is a 67 y.o. female with a pmh not limited to HLD, prediabets, iron deficiency anemia, hemorrhoids, perirectal abscesses, PUD, diverticulosis, small bowel AVMs, diverticular disease.    # 67 yo female admitted with acute vomiting, diarrhea leukocytosis, elevated liver chemistries (in mixed pattern) and elevated lipase. No findings of pancreatitis,  acute cholecystitis, choledocholithiasis or liver abnormalities on non-contrast CT scan or [place]. Viral hepatitis panel negative. Suspect acute infectious etiology.  Mentation is normal. INR normal. Liver chemistries continue to improve.   Regarding diarrhea, GI path panel is pending. C-diff antigen is positive but toxin and PCR are negative.     # Elevated lipase. Possibly secondary to vomiting and ? Decreased lipase clearance in setting of AKI. No abdominal pain and no findings of pancreatitis on CT scan ( though it was a non-contrast study). Not having any abdominal pain    # AKI- anuric. In setting of volume depletion related to diarrhea, vomiting / decreased PO intake.  Additionally she was taking ibuprofen at home. Nephrology is following. Getting HD catheter for dialysis.    # Iron deficiency anemia, followed by [facility]. History of small bowel AVMs.Baseline hgb mid 11 range, currently down to 9.6 after IV fluids.    Plan   I will ask ID about C-diff results. Treat or not?  Addendum: I spoke with ID. With negative C-diff toxin and PCR they would favor not treating for C-diff at this point.  Await Gi path panel results Continue to trend liver chemistries   Subjective  No abdominal pain. Has had two episodes of diarrhea so far this am   Objective   Imaging:  US Abdomen Limited RUQ (LIVER/GB)  Result Date: 05/09/2022 CLINICAL DATA:  Right upper quadrant pain,  jaundice EXAM: ULTRASOUND ABDOMEN LIMITED RIGHT UPPER QUADRANT COMPARISON:  Same-day CT FINDINGS: Gallbladder: Sludge in the gallbladder. No discrete gallstones. No gallbladder wall thickening. No sonographic Murphy sign noted by sonographer. Common bile duct: Diameter: 0.5 cm Liver: No focal lesion identified. Within normal limits in parenchymal echogenicity. Portal vein is patent on color Doppler imaging with normal direction of blood flow towards the liver. Other: None. IMPRESSION: Gallbladder sludge without ultrasound evidence of acute cholecystitis. Consider nuclear scintigraphic HIDA scan to further evaluate if there is clinical concern for cholecystitis or biliary ductal obstruction. Electronically Signed   By: Delanna Ahmadi M.D.   On: 05/09/2022 17:52   CT ABDOMEN PELVIS WO CONTRAST  Result Date: 05/09/2022 CLINICAL DATA:  67 year old female with history of acute onset of abdominal pain. Left renal failure. Vomiting and diarrhea. EXAM: CT ABDOMEN AND PELVIS WITHOUT CONTRAST TECHNIQUE: Multidetector CT imaging of the abdomen and pelvis was performed following the standard protocol without IV contrast. RADIATION DOSE REDUCTION: This exam was performed according to the departmental dose-optimization program which includes automated exposure control, adjustment of the mA and/or kV according to patient size and/or use of iterative reconstruction technique. COMPARISON:  CTA of the abdomen and pelvis 04/17/2016. FINDINGS: Lower chest: Atherosclerosis in the descending thoracic aorta as well as the right coronary artery. Hepatobiliary: No definite suspicious cystic or solid hepatic lesions are confidently identified on today's noncontrast CT examination. There is a small amount of amorphous intermediate attenuation material lying dependently in the gallbladder, likely to represent biliary sludge. Gallbladder is moderately distended. Gallbladder wall does not appear  thickened. No pericholecystic fluid or  surrounding inflammatory changes. Pancreas: No definite pancreatic mass or peripancreatic fluid collections or inflammatory changes are noted on today's noncontrast CT examination. Spleen: Unremarkable. Adrenals/Urinary Tract: In the anterior aspect of the lower pole the left kidney there is a 1.5 cm low-attenuation lesion, incompletely characterized on today's noncontrast CT examination, but statistically likely to represent cysts (no imaging follow-up recommended). Right kidney and bilateral adrenal glands are otherwise normal in appearance. No hydroureteronephrosis. Urinary bladder is nearly completely decompressed, but otherwise unremarkable in appearance. Stomach/Bowel: The unenhanced appearance of the stomach is normal. No pathologic dilatation of small bowel or colon. Numerous colonic diverticuli are noted, without surrounding inflammatory changes to indicate an acute diverticulitis at this time. Normal appendix. Vascular/Lymphatic: Atherosclerotic calcifications are noted in the abdominal aorta. No lymphadenopathy noted in the abdomen or pelvis. Reproductive: Status post hysterectomy. Ovaries are not confidently identified may be surgically absent or atrophic. Other: No significant volume of ascites.  No pneumoperitoneum. Musculoskeletal: There are no aggressive appearing lytic or blastic lesions noted in the visualized portions of the skeleton. IMPRESSION: 1. No acute findings are noted in the abdomen or pelvis to account for the patient's symptoms. 2. Small amount of biliary sludge lying dependently in the gallbladder. No imaging findings to suggest an acute cholecystitis at this time. 3. Colonic diverticulosis without evidence of acute diverticulitis at this time. 4. Aortic atherosclerosis. 5. Additional incidental findings, as above. Electronically Signed   By: Vinnie Langton M.D.   On: 05/09/2022 16:43    Lab Results: Recent Labs    05/10/22 0038 05/10/22 1413 05/11/22 0035 05/11/22 0522  WBC  15.9* 15.8* 15.0*  --   HGB 9.0* 10.7* 8.7* 9.6*  HCT 27.8* 32.8* 26.0* 28.6*  PLT 167 115* 122*  --    BMET Recent Labs    05/09/22 1334 05/10/22 0038 05/11/22 0035  NA 132* 133* 133*  K 4.2 4.3 4.0  CL 99 103 103  CO2 15* 11* 12*  GLUCOSE 103* 77 92  BUN 104* 107* 107*  CREATININE 13.89* 14.63* 14.31*  CALCIUM 8.9 8.4* 9.0   LFT Recent Labs    05/09/22 1757 05/10/22 0038 05/11/22 0035  PROT  --    < > 5.7*  ALBUMIN  --    < > 2.2*  AST  --    < > 195*  ALT  --    < > 522*  ALKPHOS  --    < > 650*  BILITOT  --    < > 5.3*  BILIDIR 16.2*  --   --    < > = values in this interval not displayed.   PT/INR Recent Labs    05/10/22 0038  LABPROT 13.4  INR 1.0     Scheduled inpatient medications:   amLODipine  5 mg Oral Daily   Chlorhexidine Gluconate Cloth  6 each Topical Daily   Chlorhexidine Gluconate Cloth  6 each Topical Q0600   Gerhardt's butt cream   Topical QID   Continuous inpatient infusions:   sodium bicarbonate 150 mEq in sterile water 1,150 mL infusion 75 mL/hr at 05/11/22 0654   PRN inpatient medications: labetalol, naphazoline-glycerin, ondansetron **OR** ondansetron (ZOFRAN) IV  Vital signs in last 24 hours: Temp:  [97.7 F (36.5 C)-98.7 F (37.1 C)] 98 F (36.7 C) (02/20 0213) Pulse Rate:  [84-96] 87 (02/20 0213) Resp:  [13-22] 13 (02/20 0213) BP: (144-158)/(63-75) 144/63 (02/20 0213) SpO2:  [96 %-98 %] 97 % (02/20 0213) Weight:  [  64.2 kg] 64.2 kg (02/20 0213) Last BM Date : 05/10/22  Intake/Output Summary (Last 24 hours) at 05/11/2022 0824 Last data filed at 05/11/2022 [id number] Gross per 24 hour  Intake 1987.9 ml  Output 40 ml  Net 1947.9 ml    Intake/Output from previous day: 02/19 0701 - 02/20 0700 In: 1987.9 [P.O.:240; I.V.:1343.7; IV Piggyback:404.2] Out: 40 [Urine:40] Intake/Output this shift: No intake/output data recorded.   Physical Exam:  General: Alert female in NAD Heart:  Regular rate and rhythm.  Pulmonary: Normal  respiratory effort Abdomen: Soft, nondistended, nontender. Normal bowel sounds. Neurologic: Alert and oriented Psych: Pleasant. Cooperative.    Principal Problem:   Acute renal failure (HCC) Active Problems:   Essential hypertension   Mixed hyperlipidemia   Elevated LFTs   Hyperbilirubinemia   Infectious diarrhea   Elevated lipase   High anion gap metabolic acidosis   Bandemia   Acute GI bleeding     LOS: 2 days   Tye Savoy ,NP 05/11/2022, 8:24 AM

## 2022-05-11 NOTE — Progress Notes (Signed)
[facility] Progress Note  Name: Raylei Relihan MRN: [id number] DOB: 1955-07-14  Chief Complaint:  Weakness and n/v/d  Subjective:  She had one unmeasured urine void charted and 40 mL uop over 2/19.  She has been on a bicarb gtt.  She had a foley placed on 2/19. She and I called her daughter on speakerphone.  We all discussed the risks/benefits/indications for dialysis and she does consent to dialysis.  She hasn't had any food since midnight.   Review of systems:    She reports some shortness of breath N/v may be better but she has still had considerable diarrhea Foley is in place    -------------- Background on consult:  HPI: The patient is a 67 y.o. year-old w/ PMH as below who presented to ED for gen weakness, fatigue and N/V/ D for the last 10 days. Unable to take her prescription medications. No hx liver/ kidney problems. No sick contacts. In ED BP's were wnl, HR 90-110, RR 13, afebrile. Labs showed BUN 104, creat 13.8 (Cr on Mar 26, 2022 was 0.65). Also Tbili is 20.0 and AST 608, ALT 913, alk phos 770, alb 3.0.  glucose 103. Pt rec'd IV zofran, 2 L bolus NS and will be admitted. We are asked to see for renal failure.  In [facility] pt saw per PCP w/ [facility] on 03/26/22 for HTN on norvasc, GERD, Fe def anemia, mixed HL, pre diabetes, weight loss, acute R back pain and vit D deficiency.  She was taking a lot of cold syrups and pills, not sure about nsaids.  Had dizziness and lightheadedness which is better here since getting IVF's in ED.  No hx of kidney failure.   Supplemental hx following AM: She shares that she was taking tylenol and ibuprofen every 8 hours for a stretch of time.  She feels like she has to urinate but can't.  She got on the toilet just now with assistance and had some bright red blood per rectum and minimal urine output but still feels like she needs to urinate.  The patient states that she did have some blood at home on the toilet paper, as well but not  in the toilet.     Intake/Output Summary (Last 24 hours) at 05/11/2022 0601 Last data filed at 05/11/2022 [id number] Gross per 24 hour  Intake 1987.9 ml  Output 40 ml  Net 1947.9 ml    Vitals:  Vitals:   05/10/22 1700 05/10/22 2000 05/10/22 2328 05/11/22 0213  BP: (!) 156/63 (!) 158/75 (!) 145/71 (!) 144/63  Pulse: 84 96 86 87  Resp: 14 20 (!) 22 13  Temp:  98.6 F (37 C) 97.7 F (36.5 C) 98 F (36.7 C)  TempSrc:  Oral Oral Oral  SpO2:  98% 97% 97%  Weight:    64.2 kg  Height:         Physical Exam:   General adult female in bed in no acute distress HEENT normocephalic atraumatic extraocular movements intact sclera are a bit icteric Neck supple trachea midline Lungs clear to auscultation bilaterally normal work of breathing at rest on room air Heart S1S2 no rub Abdomen soft nondistended; nt Extremities no edema  Psych normal mood and affect Neuro alert and oriented x3 provides hx and follows commands  GU foley is in place with minimal urine   Medications reviewed   Labs:     Latest Ref Rng & Units 05/11/2022   12:35 AM 05/10/2022   12:38 AM 05/09/2022  1:34 PM  BMP  Glucose 70 - 99 mg/dL 92  77  103   BUN 8 - 23 mg/dL 107  107  104   Creatinine 0.44 - 1.00 mg/dL 14.31  14.63  13.89   Sodium 135 - 145 mmol/L 133  133  132   Potassium 3.5 - 5.1 mmol/L 4.0  4.3  4.2   Chloride 98 - 111 mmol/L 103  103  99   CO2 22 - 32 mmol/L 12  11  15   $ Calcium 8.9 - 10.3 mg/dL 9.0  8.4  8.9     Last creat 0.65 from 03/26/22 in [place], [place]     CT abd wo contrast - normal appearing kidneys bilat w/o any hydronephrosis   Assessment/Plan:   AKI - anuric  - Secondary to prolonged pre-renal insults with n/v/d and prolonged viral illness in the setting of ARB as well as NSAID's.  BL Cr 0.65 from 03/26/22.  UA is unremarkable and CT abd shows no obstruction.  Thankfully transaminitis is improved slightly - hopefully renal function will follow.  She was hydrated as  felt to be dry on exam. UA with large blood but no RBC; CK was mildly elevated and has now normalized - She has been NPO since midnight  - Consult IR for tunneled HD catheter placement.  Appreciate their assistance  - HD today after access placed and plan for HD tomorrow as well  - On bicarb gtt - reduce to 75 ml/hr x 8 hours and will stop once HD is started     - Continue foley catheter  - Check ANA, ANCA, complement, SPEP, UPEP, and free light chains    Metabolic acidosis - on bicarb gtt for now and starting HD as above   Transaminitis, jaundice/ hyperbilirubinemia - transaminitis and hyperbilirubinemia are improving thankfully.  Hepatitis A, B and C non-reactive.  She has been taking tylenol.  Per primary team.  GI has been consulted  Nausea/vomiting/diarrhea - note GI has been consulted.  Supportive care per primary team.  Patient is noted to be C dif positive but later is interpreted as indeterminate.  She is on enteric precautions   GI bleed - patient with blood per rectum on arrival.  RN noted hemorrhoids on exam.  GI is consulted.   HTN - Controlled and specifically avoiding arb/ ACEi's at this time given AKI.  Avoid hypotension   Normocytic Anemia - iron is acceptable    Disposition - please continue inpatient monitoring - worsening AKI    Claudia Desanctis, MD 05/11/2022 6:45 AM

## 2022-05-12 DIAGNOSIS — K838 Other specified diseases of biliary tract: Secondary | ICD-10-CM

## 2022-05-12 DIAGNOSIS — R7989 Other specified abnormal findings of blood chemistry: Secondary | ICD-10-CM | POA: Diagnosis not present

## 2022-05-12 DIAGNOSIS — N19 Unspecified kidney failure: Secondary | ICD-10-CM

## 2022-05-12 DIAGNOSIS — R197 Diarrhea, unspecified: Secondary | ICD-10-CM

## 2022-05-12 DIAGNOSIS — K7200 Acute and subacute hepatic failure without coma: Secondary | ICD-10-CM

## 2022-05-12 LAB — ANA: Anti Nuclear Antibody (ANA): POSITIVE — AB

## 2022-05-12 LAB — C3 COMPLEMENT: C3 Complement: 161 mg/dL (ref 82–167)

## 2022-05-12 LAB — KAPPA/LAMBDA LIGHT CHAINS
Kappa free light chain: 155.3 mg/L — ABNORMAL HIGH (ref 3.3–19.4)
Kappa, lambda light chain ratio: 2.26 — ABNORMAL HIGH (ref 0.26–1.65)
Lambda free light chains: 68.8 mg/L — ABNORMAL HIGH (ref 5.7–26.3)

## 2022-05-12 LAB — COMPREHENSIVE METABOLIC PANEL
ALT: 299 U/L — ABNORMAL HIGH (ref 0–44)
AST: 99 U/L — ABNORMAL HIGH (ref 15–41)
Albumin: 2.3 g/dL — ABNORMAL LOW (ref 3.5–5.0)
Alkaline Phosphatase: 520 U/L — ABNORMAL HIGH (ref 38–126)
Anion gap: 18 — ABNORMAL HIGH (ref 5–15)
BUN: 66 mg/dL — ABNORMAL HIGH (ref 8–23)
CO2: 18 mmol/L — ABNORMAL LOW (ref 22–32)
Calcium: 8.4 mg/dL — ABNORMAL LOW (ref 8.9–10.3)
Chloride: 97 mmol/L — ABNORMAL LOW (ref 98–111)
Creatinine, Ser: 10.5 mg/dL — ABNORMAL HIGH (ref 0.44–1.00)
GFR, Estimated: 4 mL/min — ABNORMAL LOW (ref >60)
Glucose, Bld: 89 mg/dL (ref 70–99)
Potassium: 3.5 mmol/L (ref 3.5–5.1)
Sodium: 133 mmol/L — ABNORMAL LOW (ref 135–145)
Total Bilirubin: 3.5 mg/dL — ABNORMAL HIGH (ref 0.3–1.2)
Total Protein: 5.8 g/dL — ABNORMAL LOW (ref 6.5–8.1)

## 2022-05-12 LAB — PHOSPHORUS: Phosphorus: 4.8 mg/dL — ABNORMAL HIGH (ref 2.5–4.6)

## 2022-05-12 LAB — CBC
HCT: 23.5 % — ABNORMAL LOW (ref 36.0–46.0)
Hemoglobin: 8.4 g/dL — ABNORMAL LOW (ref 12.0–15.0)
MCH: 31 pg (ref 26.0–34.0)
MCHC: 35.7 g/dL (ref 30.0–36.0)
MCV: 86.7 fL (ref 80.0–100.0)
Platelets: 119 10*3/uL — ABNORMAL LOW (ref 150–400)
RBC: 2.71 MIL/uL — ABNORMAL LOW (ref 3.87–5.11)
RDW: 15.1 % (ref 11.5–15.5)
WBC: 11.7 10*3/uL — ABNORMAL HIGH (ref 4.0–10.5)
nRBC: 0.4 % — ABNORMAL HIGH (ref 0.0–0.2)

## 2022-05-12 LAB — HEPATITIS B SURFACE ANTIBODY, QUANTITATIVE: Hep B S AB Quant (Post): <3.1 m[IU]/mL — ABNORMAL LOW (ref >9.9)

## 2022-05-12 LAB — ANCA PROFILE
Anti-MPO Antibodies: <0.2 units (ref 0.0–0.9)
Anti-PR3 Antibodies: <0.2 units (ref 0.0–0.9)
Atypical P-ANCA titer: <1:20 {titer}
C-ANCA: <1:20 {titer}
P-ANCA: <1:20 {titer}

## 2022-05-12 LAB — C4 COMPLEMENT: Complement C4, Body Fluid: 45 mg/dL — ABNORMAL HIGH (ref 12–38)

## 2022-05-12 LAB — LIPASE, BLOOD: Lipase: 131 U/L — ABNORMAL HIGH (ref 11–51)

## 2022-05-12 MED ORDER — HEPARIN SODIUM (PORCINE) 1000 UNIT/ML IJ SOLN
3200.0000 [IU] | Freq: Once | INTRAMUSCULAR | Status: AC
  Administered 2022-05-12: 3200 [IU] via INTRAVENOUS
  Filled 2022-05-12: qty 4

## 2022-05-12 NOTE — Progress Notes (Signed)
   05/12/22 0900  Vitals  BP 100/64  MAP (mmHg) 69  BP Location Left Arm  BP Method Automatic  Patient Position (if appropriate) Lying  Pulse Rate (!) 101  Pulse Rate Source Monitor  ECG Heart Rate (!) 102  Resp 18  Oxygen Therapy  SpO2 99 %  O2 Device Room Air  During Treatment Monitoring  Blood Flow Rate (mL/min) 250 mL/min  Arterial Pressure (mmHg) -80 mmHg  Venous Pressure (mmHg) 120 mmHg  TMP (mmHg) 15 mmHg  Ultrafiltration Rate (mL/min) 0 mL/min  Dialysate Flow Rate (mL/min) 300 ml/min  HD Safety Checks Performed Yes  Intra-Hemodialysis Comments See progress note (OUT OF UF, NOT FEELING WELL. NS BOLUS 100 ML GIVEN. RN NOTIFIED.)  Dialysis Fluid Bolus Normal Saline  Bolus Amount (mL) 100 mL

## 2022-05-12 NOTE — Progress Notes (Signed)
Daily Progress Note  Hospital Day: 4  Chief Complaint: diarrhea, abnormal liver tests   Assessment   Brief Narrative:  Wendy Randolph is a 68 y.o. female with a pmh not limited to HLD, prediabets, iron deficiency anemia, hemorrhoids, perirectal abscesses, PUD, diverticulosis, small bowel AVMs, diverticular disease.    # Abnormal liver chemistries in mixed pattern. Acute presentation most consistent with infectious etiology +/- shock liver. Liver tests have continued to improve.   # Diarrhea  / Leukocytosis. WBC down to 11.7. GI path panel negative. C-diff studies didn't suggest active infection.  If she did have shock liver then that means she would have also been at risk for ischemic colitis. However, non-contrast CT scan was negative and she really didn't have any abdominal pain. Started low dose imodium yesterday and diarrhea has improved. Only two loose BMs today   # Elevated lipase. Possibly secondary to vomiting and ? Decreased lipase clearance in setting of AKI. No abdominal pain and no findings of pancreatitis on CT scan ( though it was a non-contrast study). Not having any abdominal pain    # AKI.  In setting of volume depletion related to diarrhea, vomiting / decreased PO intake.  Additionally she was taking ibuprofen at home.  HD started on 2/20  # Iron deficiency anemia, followed by [facility]. History of small bowel AVMs.Baseline hgb mid 11 range, currently down to 8.4 in setting of minor rectal bleeding with diarrhea,  IV fluids, labs draws, acute illness, etc.     Plan   Fecal calprotectin ordered but hasn't been collected.  Continue prn lmodium. I advised her upon discharge to take sparingly. As acute illness resolves and bowel habits returned to normal she could become constipated  Will ultimately need outpatient follow up with her GI at [place].   Subjective   Feels okay today. Back from dialysis. Only two loose BMs today which is an  improvement. She doesn't think there was any blood in her stool   Objective    Imaging:  IR Fluoro Guide CV Line Right  Result Date: 05/11/2022 INDICATION: 64 year old with acute kidney injury.  Patient needs hemodialysis. EXAM: FLUOROSCOPIC AND ULTRASOUND GUIDED PLACEMENT OF A TUNNELED DIALYSIS CATHETER Physician: Stephan Minister. Anselm Pancoast, MD MEDICATIONS: Ancef 2 g; The antibiotic was administered within an appropriate time interval prior to skin puncture. ANESTHESIA/SEDATION: Moderate (conscious) sedation was employed during this procedure. A total of Versed 37m and fentanyl 25 mcg was administered intravenously at the order of the provider performing the procedure. Total intra-service moderate sedation time: 17 minutes. Patient's level of consciousness and vital signs were monitored continuously by radiology nurse throughout the procedure under the supervision of the provider performing the procedure. FLUOROSCOPY TIME:  Radiation Exposure Index (as provided by the fluoroscopic device): 1 mGy Kerma COMPLICATIONS: None immediate. PROCEDURE: The procedure was explained to the patient. The risks and benefits of the procedure were discussed and the patient's questions were addressed. Informed consent was obtained from the patient. The patient was placed supine on the interventional table. Ultrasound confirmed a patent right internal jugular vein. Ultrasound image obtained for documentation. The right neck and chest was prepped and draped in a sterile fashion. Maximal barrier sterile technique was utilized including caps, mask, sterile gowns, sterile gloves, sterile drape, hand hygiene and skin antiseptic. The right neck was anesthetized with 1% lidocaine. A small incision was made with #11 blade scalpel. A 21 gauge needle directed into the right internal jugular vein with ultrasound guidance.  A micropuncture dilator set was placed. A 19 cm tip to cuff Palindrome catheter was selected. The skin below the right clavicle  was anesthetized and a small incision was made with an #11 blade scalpel. A subcutaneous tunnel was formed to the vein dermatotomy site. The catheter was brought through the tunnel. The vein dermatotomy site was dilated to accommodate a peel-away sheath. The catheter was placed through the peel-away sheath and directed into the central venous structures. The tip of the catheter was placed at superior cavoatrial junction with fluoroscopy. Fluoroscopic images were obtained for documentation. Both lumens were found to aspirate and flush well. The proper amount of heparin was flushed in both lumens. The vein dermatotomy site was closed using a single layer of absorbable suture and Dermabond. The catheter was secured to the skin using Prolene suture. IMPRESSION: Successful placement of a right jugular tunneled dialysis catheter using ultrasound and fluoroscopic guidance. Electronically Signed   By: Markus Daft M.D.   On: 05/11/2022 13:53   IR US Guide Vasc Access Right  Result Date: 05/11/2022 INDICATION: 65 year old with acute kidney injury.  Patient needs hemodialysis. EXAM: FLUOROSCOPIC AND ULTRASOUND GUIDED PLACEMENT OF A TUNNELED DIALYSIS CATHETER Physician: Stephan Minister. Anselm Pancoast, MD MEDICATIONS: Ancef 2 g; The antibiotic was administered within an appropriate time interval prior to skin puncture. ANESTHESIA/SEDATION: Moderate (conscious) sedation was employed during this procedure. A total of Versed 60m and fentanyl 25 mcg was administered intravenously at the order of the provider performing the procedure. Total intra-service moderate sedation time: 17 minutes. Patient's level of consciousness and vital signs were monitored continuously by radiology nurse throughout the procedure under the supervision of the provider performing the procedure. FLUOROSCOPY TIME:  Radiation Exposure Index (as provided by the fluoroscopic device): 1 mGy Kerma COMPLICATIONS: None immediate. PROCEDURE: The procedure was explained to the  patient. The risks and benefits of the procedure were discussed and the patient's questions were addressed. Informed consent was obtained from the patient. The patient was placed supine on the interventional table. Ultrasound confirmed a patent right internal jugular vein. Ultrasound image obtained for documentation. The right neck and chest was prepped and draped in a sterile fashion. Maximal barrier sterile technique was utilized including caps, mask, sterile gowns, sterile gloves, sterile drape, hand hygiene and skin antiseptic. The right neck was anesthetized with 1% lidocaine. A small incision was made with #11 blade scalpel. A 21 gauge needle directed into the right internal jugular vein with ultrasound guidance. A micropuncture dilator set was placed. A 19 cm tip to cuff Palindrome catheter was selected. The skin below the right clavicle was anesthetized and a small incision was made with an #11 blade scalpel. A subcutaneous tunnel was formed to the vein dermatotomy site. The catheter was brought through the tunnel. The vein dermatotomy site was dilated to accommodate a peel-away sheath. The catheter was placed through the peel-away sheath and directed into the central venous structures. The tip of the catheter was placed at superior cavoatrial junction with fluoroscopy. Fluoroscopic images were obtained for documentation. Both lumens were found to aspirate and flush well. The proper amount of heparin was flushed in both lumens. The vein dermatotomy site was closed using a single layer of absorbable suture and Dermabond. The catheter was secured to the skin using Prolene suture. IMPRESSION: Successful placement of a right jugular tunneled dialysis catheter using ultrasound and fluoroscopic guidance. Electronically Signed   By: AMarkus DaftM.D.   On: 05/11/2022 13:53   [place]dom[place] Limited RUQ (LIVER/GB)  Result Date: 05/09/2022 CLINICAL DATA:  Right upper quadrant pain, jaundice EXAM: ULTRASOUND ABDOMEN  LIMITED RIGHT UPPER QUADRANT COMPARISON:  Same-day CT FINDINGS: Gallbladder: Sludge in the gallbladder. No discrete gallstones. No gallbladder wall thickening. No sonographic Murphy sign noted by sonographer. Common bile duct: Diameter: 0.5 cm Liver: No focal lesion identified. Within normal limits in parenchymal echogenicity. Portal vein is patent on color Doppler imaging with normal direction of blood flow towards the liver. Other: None. IMPRESSION: Gallbladder sludge without ultrasound evidence of acute cholecystitis. Consider nuclear scintigraphic HIDA scan to further evaluate if there is clinical concern for cholecystitis or biliary ductal obstruction. Electronically Signed   By: Delanna Ahmadi M.D.   On: 05/09/2022 17:52   CT ABDOMEN PELVIS WO CONTRAST  Result Date: 05/09/2022 CLINICAL DATA:  67 year old female with history of acute onset of abdominal pain. Left renal failure. Vomiting and diarrhea. EXAM: CT ABDOMEN AND PELVIS WITHOUT CONTRAST TECHNIQUE: Multidetector CT imaging of the abdomen and pelvis was performed following the standard protocol without IV contrast. RADIATION DOSE REDUCTION: This exam was performed according to the departmental dose-optimization program which includes automated exposure control, adjustment of the mA and/or kV according to patient size and/or use of iterative reconstruction technique. COMPARISON:  CTA of the abdomen and pelvis 04/17/2016. FINDINGS: Lower chest: Atherosclerosis in the descending thoracic aorta as well as the right coronary artery. Hepatobiliary: No definite suspicious cystic or solid hepatic lesions are confidently identified on today's noncontrast CT examination. There is a small amount of amorphous intermediate attenuation material lying dependently in the gallbladder, likely to represent biliary sludge. Gallbladder is moderately distended. Gallbladder wall does not appear thickened. No pericholecystic fluid or surrounding inflammatory changes.  Pancreas: No definite pancreatic mass or peripancreatic fluid collections or inflammatory changes are noted on today's noncontrast CT examination. Spleen: Unremarkable. Adrenals/Urinary Tract: In the anterior aspect of the lower pole the left kidney there is a 1.5 cm low-attenuation lesion, incompletely characterized on today's noncontrast CT examination, but statistically likely to represent cysts (no imaging follow-up recommended). Right kidney and bilateral adrenal glands are otherwise normal in appearance. No hydroureteronephrosis. Urinary bladder is nearly completely decompressed, but otherwise unremarkable in appearance. Stomach/Bowel: The unenhanced appearance of the stomach is normal. No pathologic dilatation of small bowel or colon. Numerous colonic diverticuli are noted, without surrounding inflammatory changes to indicate an acute diverticulitis at this time. Normal appendix. Vascular/Lymphatic: Atherosclerotic calcifications are noted in the abdominal aorta. No lymphadenopathy noted in the abdomen or pelvis. Reproductive: Status post hysterectomy. Ovaries are not confidently identified may be surgically absent or atrophic. Other: No significant volume of ascites.  No pneumoperitoneum. Musculoskeletal: There are no aggressive appearing lytic or blastic lesions noted in the visualized portions of the skeleton. IMPRESSION: 1. No acute findings are noted in the abdomen or pelvis to account for the patient's symptoms. 2. Small amount of biliary sludge lying dependently in the gallbladder. No imaging findings to suggest an acute cholecystitis at this time. 3. Colonic diverticulosis without evidence of acute diverticulitis at this time. 4. Aortic atherosclerosis. 5. Additional incidental findings, as above. Electronically Signed   By: Vinnie Langton M.D.   On: 05/09/2022 16:43    Lab Results: Recent Labs    05/10/22 1413 05/11/22 0035 05/11/22 0522 05/12/22 0024  WBC 15.8* 15.0*  --  11.7*  HGB  10.7* 8.7* 9.6* 8.4*  HCT 32.8* 26.0* 28.6* 23.5*  PLT 115* 122*  --  119*   BMET Recent Labs    05/10/22 0038 05/11/22 0035  05/12/22 0024  NA 133* 133* 133*  K 4.3 4.0 3.5  CL 103 103 97*  CO2 11* 12* 18*  GLUCOSE 77 92 89  BUN 107* 107* 66*  CREATININE 14.63* 14.31* 10.50*  CALCIUM 8.4* 9.0 8.4*   LFT Recent Labs    05/11/22 0646 05/12/22 0024  PROT  --  5.8*  ALBUMIN  --  2.3*  AST  --  99*  ALT  --  299*  ALKPHOS  --  520*  BILITOT  --  3.5*  BILIDIR 2.2*  --    PT/INR Recent Labs    05/10/22 0038  LABPROT 13.4  INR 1.0     Scheduled inpatient medications:   amLODipine  5 mg Oral Daily   Chlorhexidine Gluconate Cloth  6 each Topical Q0600   Gerhardt's butt cream   Topical QID   Continuous inpatient infusions:  PRN inpatient medications: labetalol, loperamide, naphazoline-glycerin, ondansetron **OR** ondansetron (ZOFRAN) IV  Vital signs in last 24 hours: Temp:  [98.1 F (36.7 C)-98.6 F (37 C)] 98.6 F (37 C) (02/21 1112) Pulse Rate:  [84-103] 98 (02/21 1112) Resp:  [0-21] 15 (02/21 1112) BP: (100-148)/(59-132) 142/60 (02/21 1112) SpO2:  [70 %-100 %] 100 % (02/21 1020) Weight:  [65 kg-68.6 kg] 65 kg (02/21 1020) Last BM Date : 05/11/22  Intake/Output Summary (Last 24 hours) at 05/12/2022 1611 Last data filed at 05/12/2022 1020 Gross per 24 hour  Intake --  Output 160 ml  Net -160 ml    Intake/Output from previous day: 02/20 0701 - 02/21 0700 In: 600 [I.V.:500; IV Piggyback:100] Out: 150 [Urine:150] Intake/Output this shift: Total I/O In: -  Out: 10 [Other:10]   Physical Exam:  General: Alert female in NAD Heart:  Regular rate and rhythm.  Pulmonary: Normal respiratory effort Abdomen: Soft, nondistended, nontender. Normal bowel sounds. Extremities: No lower extremity edema  Neurologic: Alert and oriented Psych: Pleasant. Cooperative.    Principal Problem:   Acute renal failure (HCC) Active Problems:   Essential hypertension    Mixed hyperlipidemia   Elevated LFTs   Hyperbilirubinemia   Infectious diarrhea   Elevated lipase   High anion gap metabolic acidosis   Bandemia   Acute GI bleeding     LOS: 3 days   Tye Savoy ,NP 05/12/2022, 4:11 PM

## 2022-05-12 NOTE — Progress Notes (Signed)
[facility] Progress Note  Name: Wendy Randolph MRN: [id number] DOB: 1955-11-23  Chief Complaint:  Weakness and n/v/d  Subjective:  She had a tunneled catheter placed on 2/20 with IR and then her first HD session (per my orders, there was no UF).  She was anuric over most of 2/20 however did just have 150 mL of urine emptied from her foley from overnight.  She is glad to hear about her liver numbers continuing to get better.  Still watching kidneys.  Review of systems:    She reports some shortness of breath when she is lying flat N/v better.  She has continued with diarrhea.  No more abdominal pain Foley is in place    -------------- Background on consult:  HPI: The patient is a 67 y.o. year-old w/ PMH as below who presented to ED for gen weakness, fatigue and N/V/ D for the last 10 days. Unable to take her prescription medications. No hx liver/ kidney problems. No sick contacts. In ED BP's were wnl, HR 90-110, RR 13, afebrile. Labs showed BUN 104, creat 13.8 (Cr on Mar 26, 2022 was 0.65). Also Tbili is 20.0 and AST 608, ALT 913, alk phos 770, alb 3.0.  glucose 103. Pt rec'd IV zofran, 2 L bolus NS and will be admitted. We are asked to see for renal failure.  In CE pt saw per PCP w/ [facility] on 03/26/22 for HTN on norvasc, GERD, Fe def anemia, mixed HL, pre diabetes, weight loss, acute R back pain and vit D deficiency.  She was taking a lot of cold syrups and pills, not sure about nsaids.  Had dizziness and lightheadedness which is better here since getting IVF's in ED.  No hx of kidney failure.   Supplemental hx following AM: She shares that she was taking tylenol and ibuprofen every 8 hours for a stretch of time.  She feels like she has to urinate but can't.  She got on the toilet just now with assistance and had some bright red blood per rectum and minimal urine output but still feels like she needs to urinate.  The patient states that she did have some blood at home  on the toilet paper, as well but not in the toilet.     Intake/Output Summary (Last 24 hours) at 05/12/2022 0546 Last data filed at 05/11/2022 1721 Gross per 24 hour  Intake 600 ml  Output 0 ml  Net 600 ml    Vitals:  Vitals:   05/11/22 1721 05/11/22 2119 05/12/22 0000 05/12/22 0300  BP: (!) 147/74 (!) 143/64 133/60 136/64  Pulse: 99 92 94 94  Resp: 14 16 16 16  $ Temp: 98.1 F (36.7 C) 98.1 F (36.7 C) 98.1 F (36.7 C) 98.3 F (36.8 C)  TempSrc: Oral Oral Oral Oral  SpO2: 98% 98% 100% 100%  Weight: 68.6 kg     Height:         Physical Exam:   General adult female in bed in no acute distress HEENT normocephalic atraumatic extraocular movements intact sclera are a bit icteric Neck supple trachea midline Lungs clear to auscultation bilaterally normal work of breathing at rest on room air Heart S1S2 no rub Abdomen soft nondistended; nt Extremities no edema  Psych normal mood and affect Neuro alert and oriented x3 provides hx and follows commands  GU foley is in place with minimal urine  Access RIJ tunneled catheter in place - dressed  Medications reviewed   Labs:  Latest Ref Rng & Units 05/12/2022   12:24 AM 05/11/2022   12:35 AM 05/10/2022   12:38 AM  BMP  Glucose 70 - 99 mg/dL 89  92  77   BUN 8 - 23 mg/dL 66  107  107   Creatinine 0.44 - 1.00 mg/dL 10.50  14.31  14.63   Sodium 135 - 145 mmol/L 133  133  133   Potassium 3.5 - 5.1 mmol/L 3.5  4.0  4.3   Chloride 98 - 111 mmol/L 97  103  103   CO2 22 - 32 mmol/L 18  12  11   $ Calcium 8.9 - 10.3 mg/dL 8.4  9.0  8.4     Last creat 0.65 from 03/26/22 in [place], [place]     CT abd wo contrast - normal appearing kidneys bilat w/o any hydronephrosis   Assessment/Plan:   AKI - anuric  - Secondary to prolonged pre-renal insults with n/v/d and prolonged viral illness in the setting of ARB as well as NSAID's.  BL Cr 0.65 from 03/26/22.  UA is unremarkable and CT abd shows no obstruction.  Thankfully  transaminitis is improved slightly - hopefully renal function will follow.  She was hydrated as felt to be dry on exam. UA with large blood but no RBC; CK was mildly elevated and has now normalized.  Tunneled catheter placed 2/20 with IR. First HD session 2/20.  - HD today (her 2nd treatment).  Then plan for HD on 2/23 (Friday) - Will continue foley for now  - ANA, ANCA, complement, SPEP, UPEP, and free light chains are all pending   Metabolic acidosis - refractory to bicarb and on HD  Transaminitis, jaundice/ hyperbilirubinemia - transaminitis and hyperbilirubinemia are improving thankfully.  Hepatitis A, B and C non-reactive.  She had two tylenol over the course of her illness so not excessive intake.  Per primary team.  GI has been consulted  Nausea/vomiting/diarrhea - note GI has been consulted.  Supportive care per primary team.  Patient is noted to be C dif positive but later is interpreted as indeterminate.  GI indicates not c dif.    GI bleed - patient with blood per rectum on arrival.  RN noted hemorrhoids on exam.  GI is consulted.   HTN - Controlled and specifically avoiding arb/ ACEi's at this time given AKI.  Avoid hypotension   Normocytic Anemia - iron is acceptable.  Note blood per rectum and GI is following.  Anticipate need for ESA.  Mildly reduced platelets    Disposition - please continue inpatient monitoring - worsening AKI    Claudia Desanctis, MD 05/12/2022 6:08 AM

## 2022-05-12 NOTE — Hospital Course (Signed)
67 year old F with PMH of HTN, HLD and tobacco use disorder presenting with nausea, vomiting and diarrhea for about a week and admitted for severe acute renal failure, elevated liver enzymes with markedly elevated bilirubin and lipase likely due to dehydration from GI loss in the setting of possible viral illness with concurrent NSAID use and ARB.   In ED, she had a stable vitals. Cr 14.  BUN 104.  Bicarb 15.  AST 608.  ALT 913.  ALP 770.  Total bili 20.  Direct bili 16.2.  Lipase 1118.  CT abdomen and pelvis without acute finding.  RUQ US demonstrated gallbladder sludge with CBD measuring 0.5 cm.  Patient was started on IV fluid and transferred to [facility] for [facility] care after consultation with GI and nephrology.   [place] placed on 2/20.  HD started.  LFT improved.  Ongoing diarrhea.  Inflammatory markers elevated.  GI had been following.

## 2022-05-12 NOTE — Plan of Care (Signed)

## 2022-05-12 NOTE — Progress Notes (Signed)
Progress Note   Patient: Wendy Randolph [id number] DOB: Dec 31, 1955 DOA: 05/09/2022     3 DOS: the patient was seen and examined on 05/12/2022   Brief hospital course: 67 year old F with PMH of HTN, HLD and tobacco use disorder presenting with nausea, vomiting and diarrhea for about a week and admitted for severe acute renal failure, elevated liver enzymes with markedly elevated bilirubin and lipase likely due to dehydration from GI loss in the setting of possible viral illness with concurrent NSAID use and ARB.   In ED, she had a stable vitals. Cr 14.  BUN 104.  Bicarb 15.  AST 608.  ALT 913.  ALP 770.  Total bili 20.  Direct bili 16.2.  Lipase 1118.  CT abdomen and pelvis without acute finding.  RUQ US demonstrated gallbladder sludge with CBD measuring 0.5 cm.  Patient was started on IV fluid and transferred to [facility] for further care after consultation with GI and nephrology.   [place] placed on 2/20.  HD started.  LFT improved.  Ongoing diarrhea.  Inflammatory markers elevated.  GI had been following.  Assessment and Plan: Acute renal failure/high anion gap metabolic acidosis/hyponatremia: Baseline Cr 0.65.  AKI likely prerenal insult from dehydration due to GI loss: Concurrent use of NSAID and ARB.  CT A/P without obstruction.  UA without significant finding. -TDC placed and HD started on 2/20. -Nephrology following.  Autoimmune workup initiated. -Tolerating HD thus far   Elevated liver enzymes/hyperbilirubinemia:  -Seems to be improving.  CT abdomen and pelvis and RUQ [place] without significant finding other than some biliary sludge.  Acute hepatitis panel and HIV nonreactive. -Continue monitoring -GI has been following   Elevated lipase:  -No radiologic evidence of pancreatitis or biliary stone.   -Denies drinking alcohol. -Continue monitoring   Infectious diarrhea:  -Improved.   -Tenderness improved as well. C. difficile testing and GIP negative.  Inflammatory markers  elevated. -Follow stool chemistry and calprotectin. -Discontinued enteric precaution -GI following.   Essential hypertension: BP slightly  -Resume home amlodipine -Continue IV labetalol as needed -Continue holding losartan -Cont HD as tolerated -scheduled topical nitro stopped secondary to transient soft BP   GI bleed/normocytic anemia: RN staff noted some darker red stool this morning.  Drop in Hgb likely dilutional -Hgb trends stable   Mixed hyperlipidemia -Hold statin due to elevated liver enzymes   Leukocytosis/bandemia:  -Afebrile  -Hemodynamically stable. -blood cx neg      Subjective: Without complaints this AM. Tolerated HD. Family at bedside reports pt looks better since starting HD  Physical Exam: Vitals:   05/12/22 0930 05/12/22 1000 05/12/22 1020 05/12/22 1112  BP: (!) 148/63 (!) 142/69 (!) 147/68 (!) 142/60  Pulse: 95 98  98  Resp: 18 15  15  $ Temp:   98.2 F (36.8 C) 98.6 F (37 C)  TempSrc:   Oral Oral  SpO2: 93% 95% 100%   Weight:   65 kg   Height:       General exam: Awake, laying in bed, in nad Respiratory system: Normal respiratory effort, no wheezing Cardiovascular system: regular rate, s1, s2 Gastrointestinal system: Soft, nondistended, positive BS Central nervous system: CN2-12 grossly intact, strength intact Extremities: Perfused, no clubbing Skin: Normal skin turgor, no notable skin lesions seen Psychiatry: Mood normal // no visual hallucinations   Data Reviewed:  Labs reviewed: Na 133, K 3.5, Cr 10.5, AST 99, ALT 299  Family Communication: Pt in room, family at bedside  Disposition: Status is: Inpatient Remains inpatient appropriate  because: Severity of illness  Planned Discharge Destination: Home    Author: Marylu Lund, MD 05/12/2022 2:45 PM  For on call review [email or web address].

## 2022-05-12 NOTE — TOC Initial Note (Signed)
Transition of Care [facility]) - Initial/Assessment Note    Patient Details  Name: Wendy Randolph MRN: [id number] Date of Birth: [birth year]  Transition of Care [facility]) CM/SW Contact:    Cyndi Bender, RN Phone Number: 05/12/2022, 12:31 PM  Clinical Narrative:                 Wendy Randolph to daughter, Wendy Randolph, by phone. Patient was listening by phone.  Daughter states her mother lives with her.  Patient's PCP Dr. Kallie Locks. Patient will have transportation to out patient dialysis if needed. TOC will continue to follow for needs.   Expected Discharge Plan: Home/Self Care Barriers to Discharge: Continued Medical Work up   Patient Goals and CMS Choice Patient states their goals for this hospitalization and ongoing recovery are:: returrn home          Expected Discharge Plan and Services   Discharge Planning Services: CM Consult Post Acute Care Choice: Dialysis Living arrangements for the past 2 months: Single Family Home                                      Prior Living Arrangements/Services Living arrangements for the past 2 months: Single Family Home Lives with:: Adult Children Patient language and need for interpreter reviewed:: Yes Do you feel safe going back to the place where you live?: Yes      Need for Family Participation in Patient Care: Yes (Comment) Care giver support system in place?: Yes (comment)   Criminal Activity/Legal Involvement Pertinent to Current Situation/Hospitalization: No - Comment as needed  Activities of Daily Living      Permission Sought/Granted                  Emotional Assessment       Orientation: : Oriented to Situation, Oriented to Place, Oriented to  Time, Oriented to Self Alcohol / Substance Use: Not Applicable Psych Involvement: No (comment)  Admission diagnosis:  Biliary sludge [K83.8] Acute renal failure ([place]) [N17.9] Acute liver failure without hepatic coma [K72.00] Renal failure, unspecified chronicity  [N19] Patient Active Problem List   Diagnosis Date Noted   Elevated lipase 05/10/2022   High anion gap metabolic acidosis 0000000   Bandemia 05/10/2022   Acute GI bleeding 05/10/2022   Acute renal failure ([place]) 05/09/2022   Elevated LFTs 05/09/2022   Hyperbilirubinemia 05/09/2022   Infectious diarrhea 05/09/2022   Gastroesophageal reflux disease without esophagitis 02/20/2021   Mixed hyperlipidemia 03/20/2019   Essential hypertension 08/26/2012   PCP:  Patient, No Pcp Per Pharmacy:   [id number] [place], [place] - [address] [place] [address] [address]Phone: [phone]: [phone]    Social Determinants of Health (SDOH) Social History: SDOH Screenings   Tobacco Use: High Risk (05/11/2022)   SDOH Interventions:     Readmission Risk Interventions     No data to display

## 2022-05-12 NOTE — Progress Notes (Signed)
Received patient in bed to unit.  Alert and oriented.  Informed consent signed and in chart.   [place] duration:2.5  Patient tolerated well.  Transported back to the room  Alert, without acute distress.  Hand-off given to patient's nurse.   Access used: right [facility] Access issues: none  Total UF removed: 100  Medication(s) given: none    05/12/22 1020  Vitals  Temp 98.2 F (36.8 C)  Temp Source Oral  BP (!) 147/68  MAP (mmHg) 87  BP Location Left Arm  BP Method Automatic  Patient Position (if appropriate) Lying  Pulse Rate Source Monitor  Oxygen Therapy  SpO2 100 %  O2 Device Nasal Cannula  O2 Flow Rate (L/min) 2 L/min  During Treatment Monitoring  Blood Flow Rate (mL/min) 250 mL/min  Arterial Pressure (mmHg) -60 mmHg  Venous Pressure (mmHg) 100 mmHg  TMP (mmHg) 9 mmHg  Ultrafiltration Rate (mL/min) 0 mL/min  Dialysate Flow Rate (mL/min) 300 ml/min  HD Safety Checks Performed Yes  Intra-Hemodialysis Comments Tx completed  Post Treatment  Dialyzer Clearance Lightly streaked  Duration of HD Treatment -hour(s) 2.5 hour(s)  Liters Processed 37.5  Fluid Removed (mL) 10 mL  Tunneled CVC Double Lumen (Radiology) 05/11/22 Right Subclavian 19 cm  Placement Date/Time: 05/11/22 1313   Serial / Lot #: [id number]  Expiration Date: 01/25/27  Time Out: Correct patient;Correct site;Correct procedure  Maximum sterile barrier precautions: Hand hygiene;Cap;Mask;Sterile gown;Sterile gloves;Large sterile ...  Indication for Insertion or Continuance of Line  (hemodialysis)  Site Assessment Clean, Dry, Intact  Proximal Lumen Status Flushed;Heparin locked;Dead end cap in place (1.6 each lumen)  Distal Lumen Status Flushed;Heparin locked;Dead end cap in place (1.6 each lumen)  Dressing Type Gauze  Dressing Status Clean, Dry, Intact  Line Care Distal cap changed     Erikson Danzy S Teegan [facility]

## 2022-05-13 DIAGNOSIS — R7989 Other specified abnormal findings of blood chemistry: Secondary | ICD-10-CM | POA: Diagnosis not present

## 2022-05-13 DIAGNOSIS — K625 Hemorrhage of anus and rectum: Secondary | ICD-10-CM | POA: Diagnosis not present

## 2022-05-13 DIAGNOSIS — K838 Other specified diseases of biliary tract: Secondary | ICD-10-CM | POA: Diagnosis not present

## 2022-05-13 DIAGNOSIS — K7200 Acute and subacute hepatic failure without coma: Secondary | ICD-10-CM | POA: Diagnosis not present

## 2022-05-13 DIAGNOSIS — R197 Diarrhea, unspecified: Secondary | ICD-10-CM | POA: Diagnosis not present

## 2022-05-13 LAB — COMPREHENSIVE METABOLIC PANEL
ALT: 140 U/L — ABNORMAL HIGH (ref 0–44)
AST: 79 U/L — ABNORMAL HIGH (ref 15–41)
Albumin: 2.1 g/dL — ABNORMAL LOW (ref 3.5–5.0)
Alkaline Phosphatase: 491 U/L — ABNORMAL HIGH (ref 38–126)
Anion gap: 11 (ref 5–15)
BUN: 30 mg/dL — ABNORMAL HIGH (ref 8–23)
CO2: 25 mmol/L (ref 22–32)
Calcium: 8 mg/dL — ABNORMAL LOW (ref 8.9–10.3)
Chloride: 95 mmol/L — ABNORMAL LOW (ref 98–111)
Creatinine, Ser: 7.31 mg/dL — ABNORMAL HIGH (ref 0.44–1.00)
GFR, Estimated: 6 mL/min — ABNORMAL LOW (ref >60)
Glucose, Bld: 100 mg/dL — ABNORMAL HIGH (ref 70–99)
Potassium: 3 mmol/L — ABNORMAL LOW (ref 3.5–5.1)
Sodium: 131 mmol/L — ABNORMAL LOW (ref 135–145)
Total Bilirubin: 2.7 mg/dL — ABNORMAL HIGH (ref 0.3–1.2)
Total Protein: 5.7 g/dL — ABNORMAL LOW (ref 6.5–8.1)

## 2022-05-13 LAB — CBC
HCT: 21.3 % — ABNORMAL LOW (ref 36.0–46.0)
Hemoglobin: 7.2 g/dL — ABNORMAL LOW (ref 12.0–15.0)
MCH: 30.5 pg (ref 26.0–34.0)
MCHC: 33.8 g/dL (ref 30.0–36.0)
MCV: 90.3 fL (ref 80.0–100.0)
Platelets: 134 10*3/uL — ABNORMAL LOW (ref 150–400)
RBC: 2.36 MIL/uL — ABNORMAL LOW (ref 3.87–5.11)
RDW: 14.9 % (ref 11.5–15.5)
WBC: 11.7 10*3/uL — ABNORMAL HIGH (ref 4.0–10.5)
nRBC: 0.2 % (ref 0.0–0.2)

## 2022-05-13 LAB — PROTEIN ELECTRO, RANDOM URINE
Albumin ELP, Urine: 44.8 %
Alpha-1-Globulin, U: 8.5 %
Alpha-2-Globulin, U: 7.6 %
Beta Globulin, U: 19.9 %
Gamma Globulin, U: 19.3 %
M Component, Ur: 5.7 % — ABNORMAL HIGH
Total Protein, Urine: 168.7 mg/dL

## 2022-05-13 LAB — PROTEIN ELECTROPHORESIS, SERUM
A/G Ratio: 0.8 (ref 0.7–1.7)
Albumin ELP: 2.3 g/dL — ABNORMAL LOW (ref 2.9–4.4)
Alpha-1-Globulin: 0.4 g/dL (ref 0.0–0.4)
Alpha-2-Globulin: 0.5 g/dL (ref 0.4–1.0)
Beta Globulin: 1.3 g/dL (ref 0.7–1.3)
Gamma Globulin: 0.7 g/dL (ref 0.4–1.8)
Globulin, Total: 3 g/dL (ref 2.2–3.9)
Total Protein ELP: 5.3 g/dL — ABNORMAL LOW (ref 6.0–8.5)

## 2022-05-13 MED ORDER — DARBEPOETIN ALFA 40 MCG/0.4ML IJ SOSY
40.0000 ug | PREFILLED_SYRINGE | Freq: Once | INTRAMUSCULAR | Status: AC
  Administered 2022-05-13: 40 ug via SUBCUTANEOUS
  Filled 2022-05-13: qty 0.4

## 2022-05-13 MED ORDER — LOPERAMIDE HCL 2 MG PO CAPS
2.0000 mg | ORAL_CAPSULE | Freq: Every day | ORAL | Status: DC
  Administered 2022-05-14 – 2022-05-18 (×3): 2 mg via ORAL
  Filled 2022-05-13 (×3): qty 1

## 2022-05-13 MED ORDER — LIDOCAINE 5 % EX PTCH
1.0000 | MEDICATED_PATCH | CUTANEOUS | Status: DC
  Administered 2022-05-13 – 2022-05-19 (×2): 1 via TRANSDERMAL
  Filled 2022-05-13 (×4): qty 1

## 2022-05-13 MED ORDER — HYDROMORPHONE HCL 1 MG/ML IJ SOLN: 0.5000 mg | INTRAMUSCULAR | Status: DC | PRN

## 2022-05-13 MED ORDER — CHLORHEXIDINE GLUCONATE CLOTH 2 % EX PADS
6.0000 | MEDICATED_PAD | Freq: Every day | CUTANEOUS | Status: DC
  Administered 2022-05-14: 6 via TOPICAL

## 2022-05-13 MED ORDER — POTASSIUM CHLORIDE CRYS ER 10 MEQ PO TBCR
10.0000 meq | EXTENDED_RELEASE_TABLET | Freq: Once | ORAL | Status: AC
  Administered 2022-05-13: 10 meq via ORAL
  Filled 2022-05-13: qty 1

## 2022-05-13 MED ORDER — POTASSIUM CHLORIDE CRYS ER 20 MEQ PO TBCR
60.0000 meq | EXTENDED_RELEASE_TABLET | Freq: Once | ORAL | Status: AC
  Administered 2022-05-13: 60 meq via ORAL
  Filled 2022-05-13: qty 3

## 2022-05-13 NOTE — Progress Notes (Signed)
Progress Note   Patient: Wendy Randolph [id number] DOB: 10-01-1955 DOA: 05/09/2022     4 DOS: the patient was seen and examined on 05/13/2022   Brief hospital course: 67 year old F with PMH of HTN, HLD and tobacco use disorder presenting with nausea, vomiting and diarrhea for about a week and admitted for severe acute renal failure, elevated liver enzymes with markedly elevated bilirubin and lipase likely due to dehydration from GI loss in the setting of possible viral illness with concurrent NSAID use and ARB.   In ED, she had a stable vitals. Cr 14.  BUN 104.  Bicarb 15.  AST 608.  ALT 913.  ALP 770.  Total bili 20.  Direct bili 16.2.  Lipase 1118.  CT abdomen and pelvis without acute finding.  RUQ US demonstrated gallbladder sludge with CBD measuring 0.5 cm.  Patient was started on IV fluid and transferred to [facility] for further care after consultation with GI and nephrology.   [place] placed on 2/20.  HD started.  LFT improved.  Ongoing diarrhea.  Inflammatory markers elevated.  GI had been following.  Assessment and Plan: Acute renal failure/high anion gap metabolic acidosis/hyponatremia: Baseline Cr 0.65.  AKI likely prerenal insult from dehydration due to GI loss: Concurrent use of NSAID and ARB.  CT A/P without obstruction.  UA without significant finding. -TDC placed and HD started on 2/20. -Nephrology following.  Autoimmune workup initiated. -Has thus far tolerated HD. Next HD tomorrow   Elevated liver enzymes/hyperbilirubinemia:  -Seems to be improving.  CT abdomen and pelvis and RUQ [place] without significant finding other than some biliary sludge.  Acute hepatitis panel and HIV nonreactive. -Continue monitoring -GI had been following, now signed off  as of 2/22 -Pt reports no abd pain or nausea, eager to advance diet. Will advance to renal diet   Elevated lipase:  -No radiologic evidence of pancreatitis or biliary stone.   -Denies drinking alcohol. -no abd pain   Infectious  diarrhea:  -Improved.   -Tenderness improved as well. C. difficile testing and GIP negative.  Inflammatory markers elevated. -Follow stool chemistry and calprotectin. -Discontinued enteric precaution -GI had been following, now signed off.   Essential hypertension: BP slightly  -Resume home amlodipine -Continue IV labetalol as needed -Continue holding losartan -Cont HD as tolerated -scheduled topical nitro stopped secondary to transient soft BP, BP stable at present   GI bleed/normocytic anemia: RN staff noted some darker red stool this morning.  Drop in Hgb likely dilutional -Hgb trends stable   Mixed hyperlipidemia -Hold statin due to elevated liver enzymes   Leukocytosis/bandemia:  -Afebrile  -Hemodynamically stable. -blood cx neg  Hypokalemia -Will replace      Subjective: No complaints this AM. Denies nausea or abd pain  Physical Exam: Vitals:   05/13/22 1025 05/13/22 1028 05/13/22 1200 05/13/22 1310  BP: (!) 151/72 (!) 151/72 (!) 146/67   Pulse: 93     Resp: 19  (!) 21   Temp: 98.7 F (37.1 C)   98.2 F (36.8 C)  TempSrc: Oral   Axillary  SpO2: 94%   94%  Weight:      Height:       General exam: Conversant, in no acute distress Respiratory system: normal chest rise, clear, no audible wheezing Cardiovascular system: regular rhythm, s1-s2 Gastrointestinal system: Nondistended, nontender, pos BS Central nervous system: No seizures, no tremors Extremities: No cyanosis, no joint deformities Skin: No rashes, no pallor Psychiatry: Affect normal // no auditory hallucinations   Data Reviewed:  Labs reviewed: Na 131, K 3.0, Cr 7.31, AST 79, ALT 140  Family Communication: Pt in room, family at bedside  Disposition: Status is: Inpatient Remains inpatient appropriate because: Severity of illness  Planned Discharge Destination: Home    Author: Marylu Lund, MD 05/13/2022 1:38 PM  For on call review [email or web address].

## 2022-05-13 NOTE — Progress Notes (Signed)
Progress Note   Subjective  Hospital Day: 5 Chief Complaint: Diarrhea and abnormal LFT's  This morning, patient found with friend by her bedside.  She tells me she is doing well.  Diarrhea is still controlled on Imodium.  She has not had any sign of rectal bleeding since the day she was admitted.  She thinks it was really just from "irritation" from wiping from the diarrhea.  No new GI complaints or concerns.   Objective   Vital signs in last 24 hours: Temp:  [98.7 F (37.1 C)-99.8 F (37.7 C)] 98.7 F (37.1 C) (02/22 1025) Pulse Rate:  [92-98] 93 (02/22 1025) Resp:  [12-29] 19 (02/22 1025) BP: (115-156)/(56-80) 151/72 (02/22 1028) SpO2:  [94 %-100 %] 94 % (02/22 1025) Weight:  [64.1 kg] 64.1 kg (02/22 0500) Last BM Date : 05/13/22 General:    AA female in NAD Heart:  Regular rate and rhythm; no murmurs Lungs: Respirations even and unlabored, lungs CTA bilaterally Abdomen:  Soft, nontender and nondistended. Normal bowel sounds. Psych:  Cooperative. Normal mood and affect.  Intake/Output from previous day: 02/21 0701 - 02/22 0700 In: -  Out: 190 [Urine:180] Intake/Output this shift: Total I/O In: 240 [P.O.:240] Out: -   Lab Results: Recent Labs    05/11/22 0035 05/11/22 0522 05/12/22 0024 05/13/22 0016  WBC 15.0*  --  11.7* 11.7*  HGB 8.7* 9.6* 8.4* 7.2*  HCT 26.0* 28.6* 23.5* 21.3*  PLT 122*  --  119* 134*   BMET Recent Labs    05/11/22 0035 05/12/22 0024 05/13/22 0016  NA 133* 133* 131*  K 4.0 3.5 3.0*  CL 103 97* 95*  CO2 12* 18* 25  GLUCOSE 92 89 100*  BUN 107* 66* 30*  CREATININE 14.31* 10.50* 7.31*  CALCIUM 9.0 8.4* 8.0*   LFT Recent Labs    05/11/22 0646 05/12/22 0024 05/13/22 0016  PROT  --    < > 5.7*  ALBUMIN  --    < > 2.1*  AST  --    < > 79*  ALT  --    < > 140*  ALKPHOS  --    < > 491*  BILITOT  --    < > 2.7*  BILIDIR 2.2*  --   --    < > = values in this interval not displayed.     Studies/Results: IR Fluoro Guide CV  Line Right  Result Date: 05/11/2022 INDICATION: 82 year old with acute kidney injury.  Patient needs hemodialysis. EXAM: FLUOROSCOPIC AND ULTRASOUND GUIDED PLACEMENT OF A TUNNELED DIALYSIS CATHETER Physician: Stephan Minister. Anselm Pancoast, MD MEDICATIONS: Ancef 2 g; The antibiotic was administered within an appropriate time interval prior to skin puncture. ANESTHESIA/SEDATION: Moderate (conscious) sedation was employed during this procedure. A total of Versed 36m and fentanyl 25 mcg was administered intravenously at the order of the provider performing the procedure. Total intra-service moderate sedation time: 17 minutes. Patient's level of consciousness and vital signs were monitored continuously by radiology nurse throughout the procedure under the supervision of the provider performing the procedure. FLUOROSCOPY TIME:  Radiation Exposure Index (as provided by the fluoroscopic device): 1 mGy Kerma COMPLICATIONS: None immediate. PROCEDURE: The procedure was explained to the patient. The risks and benefits of the procedure were discussed and the patient's questions were addressed. Informed consent was obtained from the patient. The patient was placed supine on the interventional table. Ultrasound confirmed a patent right internal jugular vein. Ultrasound image obtained for documentation. The right neck and chest  was prepped and draped in a sterile fashion. Maximal barrier sterile technique was utilized including caps, mask, sterile gowns, sterile gloves, sterile drape, hand hygiene and skin antiseptic. The right neck was anesthetized with 1% lidocaine. A small incision was made with #11 blade scalpel. A 21 gauge needle directed into the right internal jugular vein with ultrasound guidance. A micropuncture dilator set was placed. A 19 cm tip to cuff Palindrome catheter was selected. The skin below the right clavicle was anesthetized and a small incision was made with an #11 blade scalpel. A subcutaneous tunnel was formed to the  vein dermatotomy site. The catheter was brought through the tunnel. The vein dermatotomy site was dilated to accommodate a peel-away sheath. The catheter was placed through the peel-away sheath and directed into the central venous structures. The tip of the catheter was placed at superior cavoatrial junction with fluoroscopy. Fluoroscopic images were obtained for documentation. Both lumens were found to aspirate and flush well. The proper amount of heparin was flushed in both lumens. The vein dermatotomy site was closed using a single layer of absorbable suture and Dermabond. The catheter was secured to the skin using Prolene suture. IMPRESSION: Successful placement of a right jugular tunneled dialysis catheter using ultrasound and fluoroscopic guidance. Electronically Signed   By: Markus Daft M.D.   On: 05/11/2022 13:53   IR US Guide Vasc Access Right  Result Date: 05/11/2022 INDICATION: 20 year old with acute kidney injury.  Patient needs hemodialysis. EXAM: FLUOROSCOPIC AND ULTRASOUND GUIDED PLACEMENT OF A TUNNELED DIALYSIS CATHETER Physician: Stephan Minister. Anselm Pancoast, MD MEDICATIONS: Ancef 2 g; The antibiotic was administered within an appropriate time interval prior to skin puncture. ANESTHESIA/SEDATION: Moderate (conscious) sedation was employed during this procedure. A total of Versed 91m and fentanyl 25 mcg was administered intravenously at the order of the provider performing the procedure. Total intra-service moderate sedation time: 17 minutes. Patient's level of consciousness and vital signs were monitored continuously by radiology nurse throughout the procedure under the supervision of the provider performing the procedure. FLUOROSCOPY TIME:  Radiation Exposure Index (as provided by the fluoroscopic device): 1 mGy Kerma COMPLICATIONS: None immediate. PROCEDURE: The procedure was explained to the patient. The risks and benefits of the procedure were discussed and the patient's questions were addressed. Informed  consent was obtained from the patient. The patient was placed supine on the interventional table. Ultrasound confirmed a patent right internal jugular vein. Ultrasound image obtained for documentation. The right neck and chest was prepped and draped in a sterile fashion. Maximal barrier sterile technique was utilized including caps, mask, sterile gowns, sterile gloves, sterile drape, hand hygiene and skin antiseptic. The right neck was anesthetized with 1% lidocaine. A small incision was made with #11 blade scalpel. A 21 gauge needle directed into the right internal jugular vein with ultrasound guidance. A micropuncture dilator set was placed. A 19 cm tip to cuff Palindrome catheter was selected. The skin below the right clavicle was anesthetized and a small incision was made with an #11 blade scalpel. A subcutaneous tunnel was formed to the vein dermatotomy site. The catheter was brought through the tunnel. The vein dermatotomy site was dilated to accommodate a peel-away sheath. The catheter was placed through the peel-away sheath and directed into the central venous structures. The tip of the catheter was placed at superior cavoatrial junction with fluoroscopy. Fluoroscopic images were obtained for documentation. Both lumens were found to aspirate and flush well. The proper amount of heparin was flushed in both lumens. The vein  dermatotomy site was closed using a single layer of absorbable suture and Dermabond. The catheter was secured to the skin using Prolene suture. IMPRESSION: Successful placement of a right jugular tunneled dialysis catheter using ultrasound and fluoroscopic guidance. Electronically Signed   By: Markus Daft M.D.   On: 05/11/2022 13:53       Assessment / Plan:   Assessment: 1.  Abnormal LFTs in a mixed pattern: Acute presentation most consistent with infectious etiology +/- shock liver 2.  Diarrhea/leukocytosis with some rectal bleeding: WBC trending down, GI path panel negative, C.  difficile studies did not suggest active infection, noncontrast CT scan was negative, Imodium has improved the diarrhea with only 1 loose bowel movement in the past 24 hours, initially presented with rectal bleeding which has since resolved, likely from rectal irritation given all the wiping 3.  Elevated lipase: Possibly secondary to vomiting, no abdominal pain and no findings of pancreatitis on CT 4.  AKI: In the setting of volume depletion related to diarrhea/vomiting/decreased p.o. intake 5.  Iron deficiency anemia: Followed by digestive [facility], history of small bowel AVMs, hemoglobin 8.4-7.2 today, no active GI bleeding  Plan: 1.  Continue supportive measures including Imodium 2.  We will sign off with the patient.  She can follow-up with her GI at [facility] upon discharge.  Thank you for your kind consultation.   LOS: 4 days   Levin Erp  05/13/2022, 12:22 PM

## 2022-05-13 NOTE — Care Management Important Message (Signed)
Important Message  Patient Details  Name: Wendy Randolph MRN: [id number] Date of Birth: [birth year]   Medicare Important Message Given:  Yes     Clark Clowdus Montine Circle 05/13/2022, 3:43 PM

## 2022-05-13 NOTE — Progress Notes (Signed)
Mobility Specialist Progress Note:   05/13/22 1626  Mobility  Activity Ambulated with assistance in room;Transferred to/from [facility]  Level of Assistance Contact guard assist, steadying assist  Assistive Device None  Distance Ambulated (ft) 4 ft  Activity Response Tolerated fair  $Mobility charge 1 Mobility   Pt in bed asking to use BR. Pt limited by loose stool so had to use BSC. Complaints of back pain. Left in bed with call bell in reach and all needs met.   Gareth Eagle Brilyn Tuller Mobility Specialist Please contact via [employer] or  Rehab Office at [phone]

## 2022-05-13 NOTE — Progress Notes (Signed)
Foley catheter discontinued at 0640.

## 2022-05-13 NOTE — Plan of Care (Signed)

## 2022-05-13 NOTE — Progress Notes (Signed)
[facility] Progress Note  Name: Wendy Randolph MRN: [id number] DOB: [birth year]  Chief Complaint:  Weakness and n/v/d  Subjective:  She had 80 mL UOP over 2/21 charted.  She has 75 mL in her foley on my exam.  Last HD on 2/21 (her 2nd treatment) with 73m removed (with a goal of 0.5 - 1 kg as tolerated).  Her HD catheter has a thick clean bandage and she states has some bleeding initially and this is better.  She and I discussed the risks/benefits/indications for a renal biopsy and she does consent for a renal biopsy.  We discussed that I would recommend this procedure early next week in the setting of her resolving concurrent liver failure.  ANA is positive and she denies a known diagnosis of lupus.   Review of systems:    Denies shortness of breath  Denies chest pain  Denies n/v.  Her diarrhea has resolved no more abdominal pain  -------------- Background on consult:  HPI: The patient is a 67y.o. year-old w/ PMH as below who presented to ED for gen weakness, fatigue and N/V/ D for the last 10 days. Unable to take her prescription medications. No hx liver/ kidney problems. No sick contacts. In ED BP's were wnl, HR 90-110, RR 13, afebrile. Labs showed BUN 104, creat 13.8 (Cr on Mar 26, 2022 was 0.65). Also Tbili is 20.0 and AST 608, ALT 913, alk phos 770, alb 3.0.  glucose 103. Pt rec'd IV zofran, 2 L bolus NS and will be admitted. We are asked to see for renal failure.  In [place] for HTN on norvasc, GERD, Fe def anemia, mixed HL, pre diabetes, weight loss, acute R back pain and vit D deficiency.  She was taking a lot of cold syrups and pills, not sure about nsaids.  Had dizziness and lightheadedness which is better here since getting IVF's in ED.  No hx of kidney failure.   Supplemental hx following AM: She shares that she was taking tylenol and ibuprofen every 8 hours for a stretch of time.  She feels like she has to urinate but can't.   She got on the toilet just now with assistance and had some bright red blood per rectum and minimal urine output but still feels like she needs to urinate.  The patient states that she did have some blood at home on the toilet paper, as well but not in the toilet.     Intake/Output Summary (Last 24 hours) at 05/13/2022 0536 Last data filed at 05/12/2022 1700 Gross per 24 hour  Intake --  Output 240 ml  Net -240 ml    Vitals:  Vitals:   05/12/22 2000 05/12/22 2355 05/13/22 0400 05/13/22 0410  BP: (!) 130/56 134/62 138/72 138/72  Pulse:  92  98  Resp: 15 16 12 15  $ Temp:  98.8 F (37.1 C)  99.8 F (37.7 C)  TempSrc:  Oral  Axillary  SpO2:    99%  Weight:      Height:         Physical Exam:   General adult female in bed in no acute distress HEENT normocephalic atraumatic extraocular movements intact sclera are a bit icteric Neck supple trachea midline Lungs clear to auscultation bilaterally normal work of breathing at rest on room air Heart S1S2 no rub Abdomen soft nondistended; nt Extremities no edema  Psych normal mood and affect Neuro alert and oriented x3  provides hx and follows commands  GU foley is in place with minimal urine  Access RIJ tunneled catheter in place - dressed with thick clean bandage   Medications reviewed   Labs:     Latest Ref Rng & Units 05/13/2022   12:16 AM 05/12/2022   12:24 AM 05/11/2022   12:35 AM  BMP  Glucose 70 - 99 mg/dL 100  89  92   BUN 8 - 23 mg/dL 30  66  107   Creatinine 0.44 - 1.00 mg/dL 7.31  10.50  14.31   Sodium 135 - 145 mmol/L 131  133  133   Potassium 3.5 - 5.1 mmol/L 3.0  3.5  4.0   Chloride 98 - 111 mmol/L 95  97  103   CO2 22 - 32 mmol/L 25  18  12   $ Calcium 8.9 - 10.3 mg/dL 8.0  8.4  9.0     Last creat 0.65 from 03/26/22 in [place], [place]     CT abd wo contrast - normal appearing kidneys bilat w/o any hydronephrosis   Assessment/Plan:   AKI - anuric  - May be secondary to prolonged pre-renal  insults with n/v/d and prolonged viral illness in the setting of ARB as well as NSAID's.  Note ANA is positive.  BL Cr 0.65 from 03/26/22.  UA is unremarkable and CT abd shows no obstruction.  Thankfully transaminitis is improved slightly - hopefully renal function will follow.  She was hydrated as felt to be dry on exam. UA with large blood but no RBC; CK was mildly elevated and has now normalized.  ANA is positive.  ANCA panel negative.  Complement normal. Tunneled catheter placed 2/20 with IR and first HD session 2/20.  - No acute indication for HD today.  Plan for HD on 2/23 (Friday) - Will discontinue foley   - Positive ANA - she would benefit from a renal biopsy.  Liver failure is resolving - would plan for biopsy on 2/26 to minimize risk of bleeding related to the same     - SPEP, UPEP are pending  - Check strict ins/outs  Metabolic acidosis - had been refractory to bicarb; on HD  Transaminitis, jaundice/ hyperbilirubinemia - transaminitis and hyperbilirubinemia are improving thankfully.  Hepatitis A, B and C non-reactive.  She had two tylenol over the course of her illness so not excessive intake.  Per primary team.  GI has been consulted  Nausea/vomiting/diarrhea - note GI has been consulted.  Supportive care per primary team.  Patient is noted to be C dif positive but later is interpreted as indeterminate.  GI indicates not c dif.    Positive ANA - noted as above.  She denies a known diagnosis of lupus.  Note that complement is normal.  Check dsDNA  Elevated free light chains - slightly elevated free light chain ratio.  May be secondary to renal failure.  SPEP and UPEP are pending.  If isolated elevation in free light chains then would trend and refer to hem/onc outpatient if remains elevated      GI bleed - patient with blood per rectum on arrival.  RN noted hemorrhoids on exam.  GI is consulted.   HTN - Controlled and specifically avoiding arb/ ACEi's at this time given AKI.  Avoid  hypotension   Normocytic Anemia - iron is acceptable.  Note blood per rectum and GI is following.  Aranesp 40 mcg ordered for 2/22.  Mild thrombocytopenia improving  Disposition - please continue inpatient monitoring    Claudia Desanctis, MD 05/13/2022 6:09 AM

## 2022-05-13 NOTE — Evaluation (Signed)
Physical Therapy Evaluation Patient Details Name: Wendy Randolph MRN: [id number] DOB: [birth year] Today's Date: 05/13/2022  History of Present Illness  67 yo female admitted 2/18 with N/V/D and acute renal failure. 2/20 TDC placed and HD initiated. PMhx: HTN, HLD, perirectal abscess  Clinical Impression  Pt pleasant with decreased awareness of deficits. Pt with decreased strength, balance, safety and function who will benefit from acute therapy to maximize mobility, safety and independence to decrease burden of care. Pt lives with daughter both of whom work and pt will need to be able to care for herself with basic functional mobility prior to return home. Encouraged OOB and mobility daily.        Recommendations for follow up therapy are one component of a multi-disciplinary discharge planning process, led by the attending physician.  Recommendations may be updated based on patient status, additional functional criteria and insurance authorization.  Follow Up Recommendations Home health PT      Assistance Recommended at Discharge    Patient can return home with the following  A little help with walking and/or transfers;A little help with bathing/dressing/bathroom;Assistance with cooking/housework;Assist for transportation;Help with stairs or ramp for entrance    Equipment Recommendations Rolling walker (2 wheels);BSC/3in1  Recommendations for Other Services       Functional Status Assessment Patient has had a recent decline in their functional status and demonstrates the ability to make significant improvements in function in a reasonable and predictable amount of time.     Precautions / Restrictions Precautions Precautions: Fall Precaution Comments: incontinent bowel Restrictions Weight Bearing Restrictions: No      Mobility  Bed Mobility Overal bed mobility: Needs Assistance Bed Mobility: Supine to Sit     Supine to sit: Supervision     General bed mobility comments:  supervision for safety and lines    Transfers Overall transfer level: Needs assistance   Transfers: Sit to/from Stand Sit to Stand: Min guard, Min assist           General transfer comment: pt with initial anterior right LOB with standing needing min assist to maintain balance and recover, additional standing trials with use of rail from toilet and RW present for guarding    Ambulation/Gait Ambulation/Gait assistance: Min guard Gait Distance (Feet): 200 Feet Assistive device: Rolling walker (2 wheels) Gait Pattern/deviations: Step-through pattern, Decreased stride length   Gait velocity interpretation: 1.31 - 2.62 ft/sec, indicative of limited community ambulator   General Gait Details: pt with initial need for single UE support for balance to walk 10' in room with transition to RW and guarding with cues for posture and proximity to [employer]    Modified Rankin (Stroke Patients Only)       Balance Overall balance assessment: Needs assistance   Sitting balance-Leahy Scale: Fair     Standing balance support: Single extremity supported, Bilateral upper extremity supported, Reliant on assistive device for balance Standing balance-Leahy Scale: Poor                               Pertinent Vitals/Pain Pain Assessment Pain Assessment: No/denies pain    Home Living Family/patient expects to be discharged to:: Private residence Living Arrangements: Children Available Help at Discharge: Family;Available PRN/intermittently Type of Home: House Home Access: Stairs to enter   Entrance Stairs-Number of Steps: 3   Home Layout: One level Home Equipment: None  Additional Comments: 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, newspaper        Extremity/Trunk Assessment   Upper Extremity Assessment Upper  Extremity Assessment: Generalized weakness    Lower Extremity Assessment Lower Extremity Assessment: Generalized weakness    Cervical / Trunk Assessment Cervical / Trunk Assessment: Kyphotic  Communication   Communication: No difficulties  Cognition Arousal/Alertness: Awake/alert Behavior During Therapy: WFL for tasks assessed/performed Overall Cognitive Status: Impaired/Different from baseline Area of Impairment: Safety/judgement                         Safety/Judgement: Decreased awareness of deficits, Decreased awareness of safety              General Comments      Exercises     Assessment/Plan    PT Assessment Patient needs continued PT services  PT Problem List Decreased strength;Decreased mobility;Decreased safety awareness;Decreased activity tolerance;Decreased balance;Decreased knowledge of use of DME       PT Treatment Interventions Gait training;Therapeutic exercise;Patient/family education;Balance training;Functional mobility training;Therapeutic activities;DME instruction;Stair training    PT Goals (Current goals can be found in the Care Plan section)  Acute Rehab PT Goals 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 PT Goal Formulation: With patient Time For Goal Achievement: 05/27/22 Potential to Achieve Goals: Good    Frequency Min 3X/week     Co-evaluation               AM-PAC PT "6 Clicks" Mobility  Outcome Measure Help needed turning from your back to your side while in a flat bed without using bedrails?: A Little Help needed moving from lying on your back to sitting on the side of a flat bed without using bedrails?: A Little Help needed moving to and from a bed to a chair (including a wheelchair)?: A Little Help needed standing up from a chair using your arms (e.g., wheelchair or bedside chair)?: A Little Help needed to walk in hospital room?: A Little Help needed climbing 3-5 steps with a railing? : A  Lot 6 Click Score: 17    End of Session   Activity Tolerance: Patient tolerated treatment well Patient left: in chair;with call bell/phone within reach;with chair alarm set Nurse Communication: Mobility status PT Visit Diagnosis: Other abnormalities of gait and mobility (R26.89);Muscle weakness (generalized) (M62.81)    Time: [id number] PT Time Calculation (min) (ACUTE ONLY): 26 min   Charges:   PT Evaluation $PT Eval Moderate Complexity: 1 Mod PT Treatments $Therapeutic Activity: 8-22 mins        Bayard Males, PT Acute Rehabilitation Services Office: [phone]   Sandy Salaam Gordon Vandunk 05/13/2022, 11:25 AM

## 2022-05-14 LAB — COMPREHENSIVE METABOLIC PANEL
ALT: 74 U/L — ABNORMAL HIGH (ref 0–44)
AST: 59 U/L — ABNORMAL HIGH (ref 15–41)
Albumin: 2.3 g/dL — ABNORMAL LOW (ref 3.5–5.0)
Alkaline Phosphatase: 426 U/L — ABNORMAL HIGH (ref 38–126)
Anion gap: 12 (ref 5–15)
BUN: 44 mg/dL — ABNORMAL HIGH (ref 8–23)
CO2: 22 mmol/L (ref 22–32)
Calcium: 8.3 mg/dL — ABNORMAL LOW (ref 8.9–10.3)
Chloride: 96 mmol/L — ABNORMAL LOW (ref 98–111)
Creatinine, Ser: 8.62 mg/dL — ABNORMAL HIGH (ref 0.44–1.00)
GFR, Estimated: 5 mL/min — ABNORMAL LOW (ref >60)
Glucose, Bld: 112 mg/dL — ABNORMAL HIGH (ref 70–99)
Potassium: 3.9 mmol/L (ref 3.5–5.1)
Sodium: 130 mmol/L — ABNORMAL LOW (ref 135–145)
Total Bilirubin: 2.4 mg/dL — ABNORMAL HIGH (ref 0.3–1.2)
Total Protein: 6.1 g/dL — ABNORMAL LOW (ref 6.5–8.1)

## 2022-05-14 LAB — CBC
HCT: 24.1 % — ABNORMAL LOW (ref 36.0–46.0)
Hemoglobin: 7.5 g/dL — ABNORMAL LOW (ref 12.0–15.0)
MCH: 29.8 pg (ref 26.0–34.0)
MCHC: 31.1 g/dL (ref 30.0–36.0)
MCV: 95.6 fL (ref 80.0–100.0)
Platelets: 188 10*3/uL (ref 150–400)
RBC: 2.52 MIL/uL — ABNORMAL LOW (ref 3.87–5.11)
RDW: 15.1 % (ref 11.5–15.5)
WBC: 13.7 10*3/uL — ABNORMAL HIGH (ref 4.0–10.5)
nRBC: 0.1 % (ref 0.0–0.2)

## 2022-05-14 LAB — ANTI-DNA ANTIBODY, DOUBLE-STRANDED: ds DNA Ab: <1 IU/mL (ref 0–9)

## 2022-05-14 MED ORDER — DARBEPOETIN ALFA 60 MCG/0.3ML IJ SOSY
60.0000 ug | PREFILLED_SYRINGE | INTRAMUSCULAR | Status: DC
  Administered 2022-05-20: 60 ug via SUBCUTANEOUS
  Filled 2022-05-14: qty 0.3

## 2022-05-14 NOTE — Plan of Care (Signed)

## 2022-05-14 NOTE — Progress Notes (Signed)
Progress Note   Patient: Wendy Randolph [id number] DOB: Jan 01, 1956 DOA: 05/09/2022     5 DOS: the patient was seen and examined on 05/14/2022   Brief hospital course: 67 year old F with PMH of HTN, HLD and tobacco use disorder presenting with nausea, vomiting and diarrhea for about a week and admitted for severe acute renal failure, elevated liver enzymes with markedly elevated bilirubin and lipase likely due to dehydration from GI loss in the setting of possible viral illness with concurrent NSAID use and ARB.   In ED, she had a stable vitals. Cr 14.  BUN 104.  Bicarb 15.  AST 608.  ALT 913.  ALP 770.  Total bili 20.  Direct bili 16.2.  Lipase 1118.  CT abdomen and pelvis without acute finding.  RUQ US demonstrated gallbladder sludge with CBD measuring 0.5 cm.  Patient was started on IV fluid and transferred to [facility] for further care after consultation with GI and nephrology.   [place] placed on 2/20.  HD started.  LFT improved.  Ongoing diarrhea.  Inflammatory markers elevated.  GI had been following.  Assessment and Plan: Acute renal failure/high anion gap metabolic acidosis/hyponatremia: Baseline Cr 0.65.  AKI likely prerenal insult from dehydration due to GI loss: Concurrent use of NSAID and ARB.  CT A/P without obstruction.  UA without significant finding. -TDC placed and HD started on 2/20. -Nephrology following.  Autoimmune workup initiated per Nephrology -Has thus far tolerated HD. Continue HD per Nephrology   Elevated liver enzymes/hyperbilirubinemia:  -Seems to be improving.  CT abdomen and pelvis and RUQ [place] without significant finding other than some biliary sludge.  Acute hepatitis panel and HIV nonreactive. -Continue monitoring -GI had been following, now signed off  as of 2/22 -Pt reports no abd pain or nausea or abd pain. Tolerating solid PO, however is reporting diarrhea   Elevated lipase:  -No radiologic evidence of pancreatitis or biliary stone.   -Denies drinking  alcohol. -no abd pain   Infectious diarrhea:  -Tenderness improved as well. C. difficile testing and GIP negative.  Inflammatory markers elevated. -Follow stool chemistry and calprotectin. -Discontinued enteric precaution -GI had been following, now signed off. -cont imodium as needed per GI recs   Essential hypertension: BP slightly  -Resume home amlodipine -Continue IV labetalol as needed -Continue holding losartan -Cont HD as tolerated per Nephrology -scheduled topical nitro stopped secondary to transient soft BP, BP stable at present   GI bleed/normocytic anemia: RN staff noted some darker red stool this morning.  Drop in Hgb likely dilutional -Pt remains hemodynamically stable   Mixed hyperlipidemia -Hold statin due to elevated liver enzymes   Leukocytosis/bandemia:  -Afebrile  -Hemodynamically stable. -blood cx neg  Hypokalemia -Corrected      Subjective: Tolerating diet, still having diarrhea  Physical Exam: Vitals:   05/14/22 1157 05/14/22 1200 05/14/22 1300 05/14/22 1400  BP: 138/63     Pulse: 94  97 96  Resp: '15 18 17 15  '$ Temp: 97.8 F (36.6 C)     TempSrc: Axillary     SpO2: 93%  97% 98%  Weight:      Height:       General exam: Awake, laying in bed, in nad Respiratory system: Normal respiratory effort, no wheezing Cardiovascular system: regular rate, s1, s2 Gastrointestinal system: Soft, nondistended, positive BS Central nervous system: CN2-12 grossly intact, strength intact Extremities: Perfused, no clubbing Skin: Normal skin turgor, no notable skin lesions seen Psychiatry: Mood normal // no visual hallucinations  Data Reviewed:  Labs reviewed: Na 130, K 3.9, Cr 8.62, AST 59, ALT 74  Family Communication: Pt in room, family at bedside  Disposition: Status is: Inpatient Remains inpatient appropriate because: Severity of illness  Planned Discharge Destination: Home    Author: Marylu Lund, MD 05/14/2022 3:11 PM  For on call review  [email or web address].

## 2022-05-14 NOTE — Progress Notes (Signed)
Mobility Specialist Progress Note:   05/14/22 0837  Mobility  Activity Transferred to/from [facility]  Level of Assistance Standby assist, set-up cues, supervision of patient - no hands on  Assistive Device None  Distance Ambulated (ft) 4 ft  Activity Response Tolerated well  $Mobility charge 1 Mobility   Pt received in [facility]. No complaints of pain. Pt still having problems with loose stool therefore declining hallway ambulation. Left EOB with call bell in reach and all needs met.   Gareth Eagle [employer] Mobility Specialist Please contact via [employer] or  Rehab Office at [phone]

## 2022-05-14 NOTE — Progress Notes (Signed)
[facility] Progress Note  Name: Wendy Randolph MRN: [id number] DOB: Dec 18, 1955  Chief Complaint:  Weakness and n/v/d  Subjective:  Strict ins/outs not available.  She had one unmeasured urine void over 2/22 charted.  She states that it wasn't much.  Last HD on 2/21 (her second treatment) with 10 ml UF (essentially was kept even).  She feels better.  We talked about her renal biopsy again and she does still want to proceed with this Monday - "whatever you think is best - I want to get better".   Review of systems:     Denies shortness of breath  Denies chest pain  Denies n/v.  Her diarrhea is resolving.  -------------- Background on consult:  HPI: The patient is a 67 y.o. year-old w/ PMH as below who presented to ED for gen weakness, fatigue and N/V/ D for the last 10 days. Unable to take her prescription medications. No hx liver/ kidney problems. No sick contacts. In ED BP's were wnl, HR 90-110, RR 13, afebrile. Labs showed BUN 104, creat 13.8 (Cr on Mar 26, 2022 was 0.65). Also Tbili is 20.0 and AST 608, ALT 913, alk phos 770, alb 3.0.  glucose 103. Pt rec'd IV zofran, 2 L bolus NS and will be admitted. We are asked to see for renal failure.  In CE pt saw per PCP w/ [place] on 03/26/22 for HTN on norvasc, GERD, Fe def anemia, mixed HL, pre diabetes, weight loss, acute R back pain and vit D deficiency.  She was taking a lot of cold syrups and pills, not sure about nsaids.  Had dizziness and lightheadedness which is better here since getting IVF's in ED.  No hx of kidney failure.   Supplemental hx following AM: She shares that she was taking tylenol and ibuprofen every 8 hours for a stretch of time.  She feels like she has to urinate but can't.  She got on the toilet just now with assistance and had some bright red blood per rectum and minimal urine output but still feels like she needs to urinate.  The patient states that she did have some blood at home on the toilet  paper, as well but not in the toilet.     Intake/Output Summary (Last 24 hours) at 05/14/2022 0527 Last data filed at 05/13/2022 0754 Gross per 24 hour  Intake 240 ml  Output 100 ml  Net 140 ml    Vitals:  Vitals:   05/13/22 1600 05/13/22 1944 05/13/22 2300 05/14/22 0325  BP: (!) 148/78 135/65 133/71 111/67  Pulse:  98  89  Resp: '16 16 15 14  '$ Temp:  98.8 F (37.1 C) 98.7 F (37.1 C) 98.3 F (36.8 C)  TempSrc:  Oral Oral Oral  SpO2: 95% 97% 98% 95%  Weight:    64.2 kg  Height:         Physical Exam:    General adult female in bed in no acute distress HEENT normocephalic atraumatic extraocular movements intact sclera are a bit icteric Neck supple trachea midline Lungs clear to auscultation bilaterally normal work of breathing at rest on room air Heart S1S2 no rub Abdomen soft nondistended; nt Extremities no edema  Psych normal mood and affect Neuro alert and oriented x3 provides hx and follows commands  GU - no foley Access RIJ tunneled catheter in place   Medications reviewed   Labs:     Latest Ref Rng & Units 05/14/2022   12:28 AM 05/13/2022  12:16 AM 05/12/2022   12:24 AM  BMP  Glucose 70 - 99 mg/dL 112  100  89   BUN 8 - 23 mg/dL 44  30  66   Creatinine 0.44 - 1.00 mg/dL 8.62  7.31  10.50   Sodium 135 - 145 mmol/L 130  131  133   Potassium 3.5 - 5.1 mmol/L 3.9  3.0  3.5   Chloride 98 - 111 mmol/L 96  95  97   CO2 22 - 32 mmol/L '22  25  18   '$ Calcium 8.9 - 10.3 mg/dL 8.3  8.0  8.4     Last creat 0.65 from 03/26/22 in [place], [place]     CT abd wo contrast - normal appearing kidneys bilat w/o any hydronephrosis   Assessment/Plan:   AKI - anuric  - May be secondary to prolonged pre-renal insults with n/v/d and prolonged viral illness in the setting of ARB as well as NSAID's.  Note ANA is positive.  BL Cr 0.65 from 03/26/22.  UA is unremarkable and CT abd shows no obstruction.  Thankfully transaminitis is improved slightly - hopefully renal  function will follow.  She was hydrated as felt to be dry on exam. UA with large blood but no RBC; CK was mildly elevated and has now normalized.  Note ANA is positive.  ANCA panel negative.  Complement normal. Tunneled catheter placed 2/20 with IR and first HD session 2/20.  - HD today then again on Monday.  Assess needs daily   - She would benefit from a renal biopsy and does consent to the procedure (see 2/22 note).  Liver failure is resolving - would plan for biopsy on 2/26 to minimize risk of bleeding related to the same.  Stain for amyloid - Check strict ins/outs  Metabolic acidosis - previously refractory to bicarb; on HD  Transaminitis, jaundice/ hyperbilirubinemia - transaminitis and hyperbilirubinemia are improving thankfully.  Hepatitis A, B and C non-reactive.  She had two tylenol over the course of her illness so not excessive intake.  Per primary team.  GI has been consulted  Nausea/vomiting/diarrhea - note GI has been consulted.  Supportive care per primary team.  Patient is noted to be C dif positive but later is interpreted as indeterminate.  GI indicates not c dif.    Positive ANA - noted as above.  She denies a known diagnosis of lupus.  Note that complement is normal.  dsDNA is pending  Elevated free light chains - slightly elevated free light chain ratio.  May be secondary to renal failure.  SPEP without M spike but UPEP with an M spike.  We are getting a renal biopsy as above.  Would benefit from outpatient hem/onc evaluation if no plasma cell dyscrasia noted on renal biopsy   GI bleed - patient with blood per rectum on arrival.  RN noted hemorrhoids on exam.  GI is consulted.   HTN - Controlled and specifically avoiding arb/ ACEi's at this time given AKI.  Avoid hypotension   Normocytic Anemia - iron is acceptable.  Note blood per rectum and GI is following.  Aranesp 40 mcg on 2/22.  Plan for ESA weekly on Thursdays while here - increased next dose to 60 mcg.  Mild  thrombocytopenia improving    Disposition - please continue inpatient monitoring    Claudia Desanctis, MD 05/14/2022 5:44 AM

## 2022-05-14 NOTE — TOC Progression Note (Signed)
Transition of Care [facility]) - Progression Note    Patient Details  Name: Wendy Randolph MRN: [id number] Date of Birth: 1955-06-28  Transition of Care [facility]) CM/SW Contact  Zenon Mayo, RN Phone Number: 05/14/2022, 11:47 AM  Clinical Narrative:    NCM spoke with patient at the bedside, her PCP is Kallie Locks in [place] on [address] at [employer], NCM offered choice for HHPT,  and rolling walker and BSC with Medicare . Gov,  she states she does not have a preference.  NCM made referral to [facility] with [employer] for BSC and rolling walker.     Expected Discharge Plan: Home/Self Care Barriers to Discharge: Continued Medical Work up  Expected Discharge Plan and Services   Discharge Planning Services: CM Consult Post Acute Care Choice: Dialysis Living arrangements for the past 2 months: Single Family Home                                       Social Determinants of Health (SDOH) Interventions SDOH Screenings   Tobacco Use: High Risk (05/11/2022)    Readmission Risk Interventions     No data to display

## 2022-05-15 LAB — COMPREHENSIVE METABOLIC PANEL
ALT: 42 U/L (ref 0–44)
AST: 43 U/L — ABNORMAL HIGH (ref 15–41)
Albumin: 2.4 g/dL — ABNORMAL LOW (ref 3.5–5.0)
Alkaline Phosphatase: 382 U/L — ABNORMAL HIGH (ref 38–126)
Anion gap: 14 (ref 5–15)
BUN: 54 mg/dL — ABNORMAL HIGH (ref 8–23)
CO2: 20 mmol/L — ABNORMAL LOW (ref 22–32)
Calcium: 8.4 mg/dL — ABNORMAL LOW (ref 8.9–10.3)
Chloride: 97 mmol/L — ABNORMAL LOW (ref 98–111)
Creatinine, Ser: 9.53 mg/dL — ABNORMAL HIGH (ref 0.44–1.00)
GFR, Estimated: 4 mL/min — ABNORMAL LOW (ref >60)
Glucose, Bld: 129 mg/dL — ABNORMAL HIGH (ref 70–99)
Potassium: 3.8 mmol/L (ref 3.5–5.1)
Sodium: 131 mmol/L — ABNORMAL LOW (ref 135–145)
Total Bilirubin: 2.2 mg/dL — ABNORMAL HIGH (ref 0.3–1.2)
Total Protein: 6.2 g/dL — ABNORMAL LOW (ref 6.5–8.1)

## 2022-05-15 NOTE — Progress Notes (Signed)
Progress Note   Patient: Wendy Randolph [id number] DOB: 09/06/1955 DOA: 05/09/2022     6 DOS: the patient was seen and examined on 05/15/2022   Brief hospital course: 67 year old F with PMH of HTN, HLD and tobacco use disorder presenting with nausea, vomiting and diarrhea for about a week and admitted for severe acute renal failure, elevated liver enzymes with markedly elevated bilirubin and lipase likely due to dehydration from GI loss in the setting of possible viral illness with concurrent NSAID use and ARB.   In ED, she had a stable vitals. Cr 14.  BUN 104.  Bicarb 15.  AST 608.  ALT 913.  ALP 770.  Total bili 20.  Direct bili 16.2.  Lipase 1118.  CT abdomen and pelvis without acute finding.  RUQ US demonstrated gallbladder sludge with CBD measuring 0.5 cm.  Patient was started on IV fluid and transferred to [facility] for further care after consultation with GI and nephrology.   [place] placed on 2/20.  HD started.  LFT improved.  Ongoing diarrhea.  Inflammatory markers elevated.  GI had been following.  Assessment and Plan: Acute renal failure/high anion gap metabolic acidosis/hyponatremia: Baseline Cr 0.65.  AKI likely prerenal insult from dehydration due to GI loss: Concurrent use of NSAID and ARB.  CT A/P without obstruction.  UA without significant finding. -TDC placed and HD started on 2/20. -Nephrology following.  Autoimmune workup initiated per Nephrology -Pt tolerating HD, seen on HD this AM -Per Nephrology, plan for renal biopsy this week   Elevated liver enzymes/hyperbilirubinemia:  -Seems to be improving.  CT abdomen and pelvis and RUQ [place] without significant finding other than some biliary sludge.  Acute hepatitis panel and HIV nonreactive. -Continue monitoring -GI had been following, now signed off  as of 2/22 -Tolerating diet, reports diarrhea has resolved   Elevated lipase:  -No radiologic evidence of pancreatitis or biliary stone.   -Denies drinking alcohol. -no abd pain    Infectious diarrhea:  -Tenderness improved as well. C. difficile testing and GIP negative.  Inflammatory markers elevated. -Follow stool chemistry and calprotectin. -Discontinued enteric precaution -GI had been following, now signed off. -Diarrhea resolved, tolerating diet   Essential hypertension: BP slightly  -Resume home amlodipine -Continue IV labetalol as needed -Continue holding losartan -Cont HD as tolerated per Nephrology -scheduled topical nitro stopped secondary to transient soft BP, BP stable at present   GI bleed/normocytic anemia: RN staff noted some darker red stool this morning.  Drop in Hgb likely dilutional -Pt remains hemodynamically stable   Mixed hyperlipidemia -Hold statin due to elevated liver enzymes   Leukocytosis/bandemia:  -Afebrile  -Hemodynamically stable. -blood cx neg  Hypokalemia -normalized      Subjective: Seen on HD. Without complaints. Reports diarrhea resolved  Physical Exam: Vitals:   05/15/22 1056 05/15/22 1100 05/15/22 1131 05/15/22 1300  BP: 125/72 134/66 127/65   Pulse: 98 93 (!) 101   Resp: '16 14 19 15  '$ Temp:  98.2 F (36.8 C) 98.2 F (36.8 C)   TempSrc:  Oral Oral   SpO2: 96% 97% 100%   Weight:  63.1 kg    Height:       General exam: Conversant, in no acute distress Respiratory system: normal chest rise, clear, no audible wheezing Cardiovascular system: regular rhythm, s1-s2 Gastrointestinal system: Nondistended, nontender, pos BS Central nervous system: No seizures, no tremors Extremities: No cyanosis, no joint deformities Skin: No rashes, no pallor Psychiatry: Affect normal // no auditory hallucinations   Data Reviewed:  Labs reviewed: Na 131, K 3.8, Cr 9.53  Family Communication: Pt in room, family not at bedside  Disposition: Status is: Inpatient Remains inpatient appropriate because: Severity of illness  Planned Discharge Destination: Home    Author: Marylu Lund, MD 05/15/2022 1:38 PM  For on  call review [email or web address].

## 2022-05-15 NOTE — Progress Notes (Signed)
[facility] Progress Note  Name: Wendy Randolph MRN: [id number] DOB: 1955/03/25  Chief Complaint:  Weakness and n/v/d  Subjective:  She had 250 mL UOP over 2/23.  She states she urinated more than before so wonder if all was captured.  Her planned HD treatment was moved to 2/24 due to staffing and patient volume.  She had a good night.     Review of systems:     Denies shortness of breath  Denies chest pain  Denies n/v.   No longer having diarrhea.  No more Abdominal pain.    -------------- Background on consult:  HPI: The patient is a 67 y.o. year-old w/ PMH as below who presented to ED for gen weakness, fatigue and N/V/ D for the last 10 days. Unable to take her prescription medications. No hx liver/ kidney problems. No sick contacts. In ED BP's were wnl, HR 90-110, RR 13, afebrile. Labs showed BUN 104, creat 13.8 (Cr on Mar 26, 2022 was 0.65). Also Tbili is 20.0 and AST 608, ALT 913, alk phos 770, alb 3.0.  glucose 103. Pt rec'd IV zofran, 2 L bolus NS and will be admitted. We are asked to see for renal failure.  In [facility] pt saw per PCP w/ [facility] on 03/26/22 for HTN on norvasc, GERD, Fe def anemia, mixed HL, pre diabetes, weight loss, acute R back pain and vit D deficiency.  She was taking a lot of cold syrups and pills, not sure about nsaids.  Had dizziness and lightheadedness which is better here since getting IVF's in ED.  No hx of kidney failure.   Supplemental hx following AM: She shares that she was taking tylenol and ibuprofen every 8 hours for a stretch of time.  She feels like she has to urinate but can't.  She got on the toilet just now with assistance and had some bright red blood per rectum and minimal urine output but still feels like she needs to urinate.  The patient states that she did have some blood at home on the toilet paper, as well but not in the toilet.     Intake/Output Summary (Last 24 hours) at 05/15/2022 0547 Last data filed at 05/14/2022  2312 Gross per 24 hour  Intake 720 ml  Output 250 ml  Net 470 ml    Vitals:  Vitals:   05/14/22 1931 05/14/22 2312 05/15/22 0309 05/15/22 0500  BP: 134/74 (!) 142/69 139/76   Pulse: 95 97 88   Resp: '16 19 14   '$ Temp: 98.6 F (37 C) 98.5 F (36.9 C) 98.1 F (36.7 C)   TempSrc: Oral Oral Oral   SpO2: 98% 95% 97%   Weight:    64.6 kg  Height:         Physical Exam:     General adult female in bed in no acute distress HEENT normocephalic atraumatic extraocular movements intact sclera are a bit icteric Neck supple trachea midline Lungs clear to auscultation bilaterally normal work of breathing at rest on room air Heart S1S2 no rub Abdomen soft nondistended; nt Extremities no edema  Psych normal mood and affect Neuro alert and oriented x3 provides hx and follows commands  GU - no foley Access RIJ tunneled catheter in place   Medications reviewed   Labs:     Latest Ref Rng & Units 05/15/2022   12:31 AM 05/14/2022   12:28 AM 05/13/2022   12:16 AM  BMP  Glucose 70 - 99 mg/dL 129  112  100   BUN 8 - 23 mg/dL 54  44  30   Creatinine 0.44 - 1.00 mg/dL 9.53  8.62  7.31   Sodium 135 - 145 mmol/L 131  130  131   Potassium 3.5 - 5.1 mmol/L 3.8  3.9  3.0   Chloride 98 - 111 mmol/L 97  96  95   CO2 22 - 32 mmol/L '20  22  25   '$ Calcium 8.9 - 10.3 mg/dL 8.4  8.3  8.0     Last creat 0.65 from 03/26/22 in [place], [place]     CT abd wo contrast - normal appearing kidneys bilat w/o any hydronephrosis   Assessment/Plan:   AKI - anuric  - May be secondary to prolonged pre-renal insults with n/v/d and prolonged viral illness in the setting of ARB as well as NSAID's.  Note ANA is positive but dsDNA < 1.  BL Cr 0.65 from 03/26/22.  UA is unremarkable and CT abd shows no obstruction.  Thankfully transaminitis is improved slightly - hopefully renal function will follow.  She was hydrated as felt to be dry on exam. UA with large blood but no RBC; CK was mildly elevated and  has now normalized.  ANCA panel negative.  Complement normal. Tunneled catheter placed 2/20 with IR and first HD session 2/20.  - HD today then again on Monday.  Monitoring for renal recovery    - Consult IR for a renal biopsy on Monday, 2/26 with IR.  Stain for amyloid - Will make NPO after 12:01 am on 05/17/22 for the renal biopsy - Check strict ins/outs  Metabolic acidosis - previously refractory to bicarb; on HD  Transaminitis, jaundice/ hyperbilirubinemia - transaminitis and hyperbilirubinemia are improving thankfully.  Hepatitis A, B and C non-reactive.  She had two tylenol over the course of her illness so not excessive intake.  Per primary team.  GI was consulted and has signed off  Nausea/vomiting/diarrhea - note GI has been consulted.  Supportive care per primary team.  Patient is noted to be C dif positive but later is interpreted as indeterminate.  GI indicates not c dif.    Positive ANA - noted as above.  She denies a known diagnosis of lupus.  Note that complement is normal.  dsDNA is less than 1  Elevated free light chains - slightly elevated free light chain ratio.  May be secondary to renal failure.  SPEP without M spike but UPEP with an M spike.  We are getting a renal biopsy as above.  Would benefit from outpatient hem/onc evaluation if no plasma cell dyscrasia noted on renal biopsy   GI bleed - patient with blood per rectum on arrival.  RN noted hemorrhoids on exam.  GI was consulted and recommends outpatient follow-up for the same (she is known to [facility] per GI charting).   HTN - Controlled and specifically avoiding arb/ ACEi's at this time given AKI.  Avoid hypotension   Normocytic Anemia - iron is acceptable.  Note blood per rectum and GI is following.  Aranesp Thursdays while here - have increased next dose to 60 mcg.  Mild thrombocytopenia resolved    Disposition - please continue inpatient monitoring    Claudia Desanctis, MD 05/15/2022 6:05 AM

## 2022-05-15 NOTE — Procedures (Signed)
Seen and examined on dialysis.  Procedure supervised.  Blood pressure 136/77 and HR 100.  RIJ tunn catheter.  Tolerating goal.    Claudia Desanctis, MD 05/15/2022 10:02 AM

## 2022-05-16 LAB — RENAL FUNCTION PANEL
Albumin: 2.3 g/dL — ABNORMAL LOW (ref 3.5–5.0)
Anion gap: 10 (ref 5–15)
BUN: 25 mg/dL — ABNORMAL HIGH (ref 8–23)
CO2: 25 mmol/L (ref 22–32)
Calcium: 7.9 mg/dL — ABNORMAL LOW (ref 8.9–10.3)
Chloride: 95 mmol/L — ABNORMAL LOW (ref 98–111)
Creatinine, Ser: 6.1 mg/dL — ABNORMAL HIGH (ref 0.44–1.00)
GFR, Estimated: 7 mL/min — ABNORMAL LOW (ref >60)
Glucose, Bld: 110 mg/dL — ABNORMAL HIGH (ref 70–99)
Phosphorus: 3.9 mg/dL (ref 2.5–4.6)
Potassium: 3.7 mmol/L (ref 3.5–5.1)
Sodium: 130 mmol/L — ABNORMAL LOW (ref 135–145)

## 2022-05-16 LAB — CBC
HCT: 21 % — ABNORMAL LOW (ref 36.0–46.0)
Hemoglobin: 7 g/dL — ABNORMAL LOW (ref 12.0–15.0)
MCH: 31 pg (ref 26.0–34.0)
MCHC: 33.3 g/dL (ref 30.0–36.0)
MCV: 92.9 fL (ref 80.0–100.0)
Platelets: 283 10*3/uL (ref 150–400)
RBC: 2.26 MIL/uL — ABNORMAL LOW (ref 3.87–5.11)
RDW: 14.8 % (ref 11.5–15.5)
WBC: 12.5 10*3/uL — ABNORMAL HIGH (ref 4.0–10.5)
nRBC: 0 % (ref 0.0–0.2)

## 2022-05-16 LAB — CULTURE, BLOOD (ROUTINE X 2)
Culture: NO GROWTH
Culture: NO GROWTH
Special Requests: ADEQUATE
Special Requests: ADEQUATE

## 2022-05-16 LAB — ABO/RH: ABO/RH(D): O POS

## 2022-05-16 LAB — PREPARE RBC (CROSSMATCH)

## 2022-05-16 LAB — HEMOGLOBIN AND HEMATOCRIT, BLOOD
HCT: 25.5 % — ABNORMAL LOW (ref 36.0–46.0)
Hemoglobin: 8.6 g/dL — ABNORMAL LOW (ref 12.0–15.0)

## 2022-05-16 MED ORDER — SODIUM CHLORIDE 0.9% IV SOLUTION: Freq: Once | INTRAVENOUS | Status: AC

## 2022-05-16 NOTE — Plan of Care (Signed)
  Problem: Health Behavior/Discharge Planning: Goal: Ability to manage health-related needs will improve 05/16/2022 0418 by Marcie Mowers, RN Outcome: Progressing 05/16/2022 0416 by Marcie Mowers, RN Outcome: Progressing 05/16/2022 0415 by Marcie Mowers, RN Outcome: Progressing   Problem: Clinical Measurements: Goal: Ability to maintain clinical measurements within normal limits will improve 05/16/2022 0418 by Marcie Mowers, RN Outcome: Progressing 05/16/2022 0416 by Marcie Mowers, RN Outcome: Progressing 05/16/2022 0415 by Marcie Mowers, RN Outcome: Progressing Goal: Will remain free from infection 05/16/2022 0418 by Marcie Mowers, RN Outcome: Progressing 05/16/2022 0415 by Marcie Mowers, RN Outcome: Progressing Goal: Diagnostic test results will improve 05/16/2022 0418 by Marcie Mowers, RN Outcome: Progressing 05/16/2022 0415 by Marcie Mowers, RN Outcome: Progressing Goal: Respiratory complications will improve Outcome: Progressing

## 2022-05-16 NOTE — Consult Note (Signed)
Chief Complaint: Patient was seen in consultation today for acute renal failure.  Referring Physician(s): Harrie Jeans, MD  Supervising Physician: Jacqulynn Cadet  Patient Status: [facility] - In-pt  History of Present Illness: Wendy Randolph is a 67 y.o. female with a past medical history significant for HTN, HLD who presented to the ED 05/09/22 with complaints of nausea, vomiting and diarrhea x 10 days. Work up in the ED showed BUN 104 and creatinine 13.8 (creatinine previously 0.65 on 03/26/22), as well as significantly elevated LFTs. She was admitted and nephrology was consulted for acute renal failure. She required tunneled HD catheter placement on 2/20 in IR to begin HD. IR has been re-consulted for a random renal biopsy to further evaluate acute renal failure.  Wendy Randolph was seen in her room this afternoon, she is having a salad and watching TV. Blood transfusion running. She denies any complaints. She is aware of the renal biopsy and is agreeable to proceed.  Past Medical History:  Diagnosis Date   High cholesterol    Hypertension    Perirectal abscess 04/17/2016    Past Surgical History:  Procedure Laterality Date   ABDOMINAL HYSTERECTOMY     INCISION AND DRAINAGE PERIRECTAL ABSCESS N/A 04/18/2016   Procedure: IRRIGATION AND DEBRIDEMENT PERIRECTAL ABSCESS;  Surgeon: Excell Seltzer, MD;  Location: [facility] ORS;  Service: General;  Laterality: N/A;   IR FLUORO GUIDE CV LINE RIGHT  05/11/2022   IR US GUIDE VASC ACCESS RIGHT  05/11/2022    Allergies: Patient has no known allergies.  Medications: Prior to Admission medications   Medication Sig Start Date End Date Taking? Authorizing Provider  amLODipine (NORVASC) 5 MG tablet Take 5 mg by mouth daily. 03/29/22  Yes [provider]  Ferrous Sulfate (IRON PO) Take 1 tablet by mouth daily.   Yes [provider]  ibuprofen (ADVIL) 200 MG tablet Take 200-600 mg by mouth every 6 (six) hours as needed for mild pain.    Yes [provider]  latanoprost (XALATAN) 0.005 % ophthalmic solution Place 1 drop into both eyes at bedtime. 05/02/22  Yes [provider]  losartan (COZAAR) 50 MG tablet Take 50 mg by mouth daily.  09/26/15 05/10/22 Yes [provider]  pantoprazole (PROTONIX) 40 MG tablet Take 40 mg by mouth daily. 03/04/22  Yes [provider]  rosuvastatin (CRESTOR) 40 MG tablet Take 40 mg by mouth daily. 12/06/21  Yes [provider]  VITAMIN D PO Take 1 tablet by mouth daily.   Yes [provider]     History reviewed. No pertinent family history.  Social History   Socioeconomic History   Marital status: Single    Spouse name: Not on file   Number of children: Not on file   Years of education: Not on file   Highest education level: Not on file  Occupational History   Not on file  Tobacco Use   Smoking status: Every Day    Packs/day: 0.25    Types: Cigarettes   Smokeless tobacco: Never  Substance and Sexual Activity   Alcohol use: Yes    Alcohol/week: 3.0 standard drinks of alcohol    Types: 3 Shots of liquor per week   Drug use: Not Currently    Comment: Socially   Sexual activity: Not on file  Other Topics Concern   Not on file  Social History Narrative   Not on file   Social Determinants of Health   Financial Resource Strain: Not on file  Food Insecurity: Not on file  Transportation Needs: Not on file  Physical Activity: Not on file  Stress: Not on file  Social Connections: Not on file     Review of Systems: A 12 point ROS discussed and pertinent positives are indicated in the HPI above.  All other systems are negative.  Review of Systems  Constitutional:  Negative for chills and fever.  Respiratory:  Negative for cough and shortness of breath.   Cardiovascular:  Negative for chest pain.  Gastrointestinal:  Negative for abdominal pain, nausea and vomiting.  Musculoskeletal:  Negative for back pain.    Vital Signs: BP  (!) 148/68 (BP Location: Left Arm)   Pulse 97   Temp 98 F (36.7 C) (Oral)   Resp 16   Ht '5\' 6"'$  (1.676 m)   Wt 139 lb 1.8 oz (63.1 kg)   SpO2 96%   BMI 22.45 kg/m   Physical Exam Vitals and nursing note reviewed.  Constitutional:      General: She is not in acute distress. HENT:     Head: Normocephalic.     Mouth/Throat:     Mouth: Mucous membranes are moist.     Pharynx: Oropharynx is clear. No oropharyngeal exudate or posterior oropharyngeal erythema.  Cardiovascular:     Rate and Rhythm: Normal rate and regular rhythm.  Pulmonary:     Effort: Pulmonary effort is normal.     Breath sounds: Normal breath sounds.  Abdominal:     Palpations: Abdomen is soft.  Skin:    General: Skin is warm and dry.  Neurological:     Mental Status: She is alert and oriented to person, place, and time.  Psychiatric:        Mood and Affect: Mood normal.        Behavior: Behavior normal.        Thought Content: Thought content normal.        Judgment: Judgment normal.      MD Evaluation Airway: WNL Heart: WNL Abdomen: WNL Chest/ Lungs: WNL ASA  Classification: 3 Mallampati/Airway Score: One   Imaging: IR Fluoro Guide CV Line Right  Result Date: 05/11/2022 INDICATION: 62 year old with acute kidney injury.  Patient needs hemodialysis. EXAM: FLUOROSCOPIC AND ULTRASOUND GUIDED PLACEMENT OF A TUNNELED DIALYSIS CATHETER Physician: Stephan Minister. Anselm Pancoast, MD MEDICATIONS: Ancef 2 g; The antibiotic was administered within an appropriate time interval prior to skin puncture. ANESTHESIA/SEDATION: Moderate (conscious) sedation was employed during this procedure. A total of Versed '1mg'$  and fentanyl 25 mcg was administered intravenously at the order of the provider performing the procedure. Total intra-service moderate sedation time: 17 minutes. Patient's level of consciousness and vital signs were monitored continuously by radiology nurse throughout the procedure under the supervision of the provider  performing the procedure. FLUOROSCOPY TIME:  Radiation Exposure Index (as provided by the fluoroscopic device): 1 mGy Kerma COMPLICATIONS: None immediate. PROCEDURE: The procedure was explained to the patient. The risks and benefits of the procedure were discussed and the patient's questions were addressed. Informed consent was obtained from the patient. The patient was placed supine on the interventional table. Ultrasound confirmed a patent right internal jugular vein. Ultrasound image obtained for documentation. The right neck and chest was prepped and draped in a sterile fashion. Maximal barrier sterile technique was utilized including caps, mask, sterile gowns, sterile gloves, sterile drape, hand hygiene and skin antiseptic. The right neck was anesthetized with 1% lidocaine. A small incision was made with #11 blade scalpel. A 21 gauge needle  directed into the right internal jugular vein with ultrasound guidance. A micropuncture dilator set was placed. A 19 cm tip to cuff Palindrome catheter was selected. The skin below the right clavicle was anesthetized and a small incision was made with an #11 blade scalpel. A subcutaneous tunnel was formed to the vein dermatotomy site. The catheter was brought through the tunnel. The vein dermatotomy site was dilated to accommodate a peel-away sheath. The catheter was placed through the peel-away sheath and directed into the central venous structures. The tip of the catheter was placed at superior cavoatrial junction with fluoroscopy. Fluoroscopic images were obtained for documentation. Both lumens were found to aspirate and flush well. The proper amount of heparin was flushed in both lumens. The vein dermatotomy site was closed using a single layer of absorbable suture and Dermabond. The catheter was secured to the skin using Prolene suture. IMPRESSION: Successful placement of a right jugular tunneled dialysis catheter using ultrasound and fluoroscopic guidance.  Electronically Signed   By: Markus Daft M.D.   On: 05/11/2022 13:53   IR US Guide Vasc Access Right  Result Date: 05/11/2022 INDICATION: 83 year old with acute kidney injury.  Patient needs hemodialysis. EXAM: FLUOROSCOPIC AND ULTRASOUND GUIDED PLACEMENT OF A TUNNELED DIALYSIS CATHETER Physician: Stephan Minister. Anselm Pancoast, MD MEDICATIONS: Ancef 2 g; The antibiotic was administered within an appropriate time interval prior to skin puncture. ANESTHESIA/SEDATION: Moderate (conscious) sedation was employed during this procedure. A total of Versed '1mg'$  and fentanyl 25 mcg was administered intravenously at the order of the provider performing the procedure. Total intra-service moderate sedation time: 17 minutes. Patient's level of consciousness and vital signs were monitored continuously by radiology nurse throughout the procedure under the supervision of the provider performing the procedure. FLUOROSCOPY TIME:  Radiation Exposure Index (as provided by the fluoroscopic device): 1 mGy Kerma COMPLICATIONS: None immediate. PROCEDURE: The procedure was explained to the patient. The risks and benefits of the procedure were discussed and the patient's questions were addressed. Informed consent was obtained from the patient. The patient was placed supine on the interventional table. Ultrasound confirmed a patent right internal jugular vein. Ultrasound image obtained for documentation. The right neck and chest was prepped and draped in a sterile fashion. Maximal barrier sterile technique was utilized including caps, mask, sterile gowns, sterile gloves, sterile drape, hand hygiene and skin antiseptic. The right neck was anesthetized with 1% lidocaine. A small incision was made with #11 blade scalpel. A 21 gauge needle directed into the right internal jugular vein with ultrasound guidance. A micropuncture dilator set was placed. A 19 cm tip to cuff Palindrome catheter was selected. The skin below the right clavicle was anesthetized and a  small incision was made with an #11 blade scalpel. A subcutaneous tunnel was formed to the vein dermatotomy site. The catheter was brought through the tunnel. The vein dermatotomy site was dilated to accommodate a peel-away sheath. The catheter was placed through the peel-away sheath and directed into the central venous structures. The tip of the catheter was placed at superior cavoatrial junction with fluoroscopy. Fluoroscopic images were obtained for documentation. Both lumens were found to aspirate and flush well. The proper amount of heparin was flushed in both lumens. The vein dermatotomy site was closed using a single layer of absorbable suture and Dermabond. The catheter was secured to the skin using Prolene suture. IMPRESSION: Successful placement of a right jugular tunneled dialysis catheter using ultrasound and fluoroscopic guidance. Electronically Signed   By: Scherrie Gerlach.D.  On: 05/11/2022 13:53   US Abdomen Limited RUQ (LIVER/GB)  Result Date: 05/09/2022 CLINICAL DATA:  Right upper quadrant pain, jaundice EXAM: ULTRASOUND ABDOMEN LIMITED RIGHT UPPER QUADRANT COMPARISON:  Same-day CT FINDINGS: Gallbladder: Sludge in the gallbladder. No discrete gallstones. No gallbladder wall thickening. No sonographic Murphy sign noted by sonographer. Common bile duct: Diameter: 0.5 cm Liver: No focal lesion identified. Within normal limits in parenchymal echogenicity. Portal vein is patent on color Doppler imaging with normal direction of blood flow towards the liver. Other: None. IMPRESSION: Gallbladder sludge without ultrasound evidence of acute cholecystitis. Consider nuclear scintigraphic HIDA scan to further evaluate if there is clinical concern for cholecystitis or biliary ductal obstruction. Electronically Signed   By: Delanna Ahmadi M.D.   On: 05/09/2022 17:52   CT ABDOMEN PELVIS WO CONTRAST  Result Date: 05/09/2022 CLINICAL DATA:  67 year old female with history of acute onset of abdominal pain. Left  renal failure. Vomiting and diarrhea. EXAM: CT ABDOMEN AND PELVIS WITHOUT CONTRAST TECHNIQUE: Multidetector CT imaging of the abdomen and pelvis was performed following the standard protocol without IV contrast. RADIATION DOSE REDUCTION: This exam was performed according to the departmental dose-optimization program which includes automated exposure control, adjustment of the mA and/or kV according to patient size and/or use of iterative reconstruction technique. COMPARISON:  CTA of the abdomen and pelvis 04/17/2016. FINDINGS: Lower chest: Atherosclerosis in the descending thoracic aorta as well as the right coronary artery. Hepatobiliary: No definite suspicious cystic or solid hepatic lesions are confidently identified on today's noncontrast CT examination. There is a small amount of amorphous intermediate attenuation material lying dependently in the gallbladder, likely to represent biliary sludge. Gallbladder is moderately distended. Gallbladder wall does not appear thickened. No pericholecystic fluid or surrounding inflammatory changes. Pancreas: No definite pancreatic mass or peripancreatic fluid collections or inflammatory changes are noted on today's noncontrast CT examination. Spleen: Unremarkable. Adrenals/Urinary Tract: In the anterior aspect of the lower pole the left kidney there is a 1.5 cm low-attenuation lesion, incompletely characterized on today's noncontrast CT examination, but statistically likely to represent cysts (no imaging follow-up recommended). Right kidney and bilateral adrenal glands are otherwise normal in appearance. No hydroureteronephrosis. Urinary bladder is nearly completely decompressed, but otherwise unremarkable in appearance. Stomach/Bowel: The unenhanced appearance of the stomach is normal. No pathologic dilatation of small bowel or colon. Numerous colonic diverticuli are noted, without surrounding inflammatory changes to indicate an acute diverticulitis at this time. Normal  appendix. Vascular/Lymphatic: Atherosclerotic calcifications are noted in the abdominal aorta. No lymphadenopathy noted in the abdomen or pelvis. Reproductive: Status post hysterectomy. Ovaries are not confidently identified may be surgically absent or atrophic. Other: No significant volume of ascites.  No pneumoperitoneum. Musculoskeletal: There are no aggressive appearing lytic or blastic lesions noted in the visualized portions of the skeleton. IMPRESSION: 1. No acute findings are noted in the abdomen or pelvis to account for the patient's symptoms. 2. Small amount of biliary sludge lying dependently in the gallbladder. No imaging findings to suggest an acute cholecystitis at this time. 3. Colonic diverticulosis without evidence of acute diverticulitis at this time. 4. Aortic atherosclerosis. 5. Additional incidental findings, as above. Electronically Signed   By: Vinnie Langton M.D.   On: 05/09/2022 16:43    Labs:  CBC: Recent Labs    05/12/22 0024 05/13/22 0016 05/14/22 0028 05/16/22 0021  WBC 11.7* 11.7* 13.7* 12.5*  HGB 8.4* 7.2* 7.5* 7.0*  HCT 23.5* 21.3* 24.1* 21.0*  PLT 119* 134* 188 283    COAGS: Recent  Labs    05/10/22 0038  INR 1.0    BMP: Recent Labs    05/13/22 0016 05/14/22 0028 05/15/22 0031 05/16/22 0021  NA 131* 130* 131* 130*  K 3.0* 3.9 3.8 3.7  CL 95* 96* 97* 95*  CO2 25 22 20* 25  GLUCOSE 100* 112* 129* 110*  BUN 30* 44* 54* 25*  CALCIUM 8.0* 8.3* 8.4* 7.9*  CREATININE 7.31* 8.62* 9.53* 6.10*  GFRNONAA 6* 5* 4* 7*    LIVER FUNCTION TESTS: Recent Labs    05/12/22 0024 05/13/22 0016 05/14/22 0028 05/15/22 0031 05/16/22 0021  BILITOT 3.5* 2.7* 2.4* 2.2*  --   AST 99* 79* 59* 43*  --   ALT 299* 140* 74* 42  --   ALKPHOS 520* 491* 426* 382*  --   PROT 5.8* 5.7* 6.1* 6.2*  --   ALBUMIN 2.3* 2.1* 2.3* 2.4* 2.3*    TUMOR MARKERS: No results for input(s): "AFPTM", "CEA", "CA199", "CHROMGRNA" in the last 8760 hours.  Assessment and  Plan:  67 y/o F who presented to the ED 05/09/22 with complaints of nausea, vomiting and diarrhea x 10 days. She was found to be in acute renal failure and required initiation of HD via right IJ tunneled HD catheter placed 2/20 in IR.   IR has been re-consulted for a random renal biopsy to further evaluate cause of ARF.   Plan: - Biopsy tentatively planned for 2/26 in [place] pending any emergent procedures which may delay case - NPO at midnight, please continue to give meds (particularly BP meds) with sips of water.  - INR/CBC in AM - Will assess BP in AM (needs to be < 160/90 mmHg to safely proceed) - IR will call for patient when ready  Risks and benefits of random renal biopsy was discussed with the patient and/or patient's family including, but not limited to bleeding, infection, damage to adjacent structures or low yield requiring additional tests.  All of the questions were answered and there is agreement to proceed.  Consent signed and in chart.  Thank you for this interesting consult.  I greatly enjoyed meeting Wendy Randolph and look forward to participating in their care.  A copy of this report was sent to the requesting provider on this date.  Electronically Signed: Joaquim Nam, PA-C 05/16/2022, 10:03 AM   I spent a total of 40 Minutes in face to face in clinical consultation, greater than 50% of which was counseling/coordinating care for acute renal failure.

## 2022-05-16 NOTE — Progress Notes (Signed)
Mobility Specialist Progress Note:   05/16/22 0900  Mobility  Activity Ambulated with assistance in hallway  Level of Assistance Contact guard assist, steadying assist  Assistive Device None;Other (Comment) (IV Pole)  Distance Ambulated (ft) 300 ft  Activity Response Tolerated well  Mobility Referral Yes  $Mobility charge 1 Mobility   Pt agreeable to mobility session. Required minG assist for safety. Trialed gait with and without AD, pt displayed steadiness with both. Pt back in bed with all needs met.   Nelta Numbers Mobility Specialist Please contact via SecureChat or  Rehab office at [phone]

## 2022-05-16 NOTE — Progress Notes (Signed)
Progress Note   Patient: Wendy Randolph [id number] DOB: Feb 25, 1956 DOA: 05/09/2022     7 DOS: the patient was seen and examined on 05/16/2022   Brief hospital course: 67 year old F with PMH of HTN, HLD and tobacco use disorder presenting with nausea, vomiting and diarrhea for about a week and admitted for severe acute renal failure, elevated liver enzymes with markedly elevated bilirubin and lipase likely due to dehydration from GI loss in the setting of possible viral illness with concurrent NSAID use and ARB.   In ED, she had a stable vitals. Cr 14.  BUN 104.  Bicarb 15.  AST 608.  ALT 913.  ALP 770.  Total bili 20.  Direct bili 16.2.  Lipase 1118.  CT abdomen and pelvis without acute finding.  RUQ US demonstrated gallbladder sludge with CBD measuring 0.5 cm.  Patient was started on IV fluid and transferred to [facility] for further care after consultation with GI and nephrology.   [place] placed on 2/20.  HD started.  LFT improved.  Ongoing diarrhea.  Inflammatory markers elevated.  GI had been following.  Assessment and Plan: Acute renal failure/high anion gap metabolic acidosis/hyponatremia: Baseline Cr 0.65.  AKI likely prerenal insult from dehydration due to GI loss: Concurrent use of NSAID and ARB.  CT A/P without obstruction.  UA without significant finding. -TDC placed and HD started on 2/20. -Nephrology following.  Autoimmune workup initiated per Nephrology -Continues to tolerate HD -Pt is scheduled for renal biopsy 2/26   Elevated liver enzymes/hyperbilirubinemia:  -Seems to be improving.  CT abdomen and pelvis and RUQ [place] without significant finding other than some biliary sludge.  Acute hepatitis panel and HIV nonreactive. -GI had been following, now signed off  as of 2/22 -Tolerating diet well   Elevated lipase:  -No radiologic evidence of pancreatitis or biliary stone.   -Denies drinking alcohol. -no abd pain   Infectious diarrhea:  -Tenderness improved as well. C. difficile  testing and GIP negative.  Inflammatory markers elevated. -Follow stool chemistry and calprotectin. -Discontinued enteric precaution -GI had been following, now signed off. -Diarrhea resolved, continues to tolerate regular diet   Essential hypertension: BP slightly  -Resume home amlodipine -Continue IV labetalol as needed -Continue holding losartan -Cont HD as tolerated per Nephrology -scheduled topical nitro stopped secondary to transient soft BP, BP stable at present   GI bleed/normocytic anemia: RN staff noted some darker red stool this morning.  Drop in Hgb likely dilutional -Pt remains hemodynamically stable -Getting PRBC transfusion this AM -Recheck CBC in AM   Mixed hyperlipidemia -Hold statin due to elevated liver enzymes   Leukocytosis/bandemia:  -Afebrile  -Hemodynamically stable. -blood cx neg  Hypokalemia -normalized      Subjective: Feeling nervous about biopsy tomorrow  Physical Exam: Vitals:   05/16/22 1300 05/16/22 1400 05/16/22 1500 05/16/22 1527  BP:    137/71  Pulse:  (!) 101 84 88  Resp: '16 17 15 15  '$ Temp:    98.3 F (36.8 C)  TempSrc:    Oral  SpO2:  97% 96% 100%  Weight:      Height:       General exam: Awake, laying in bed, in nad Respiratory system: Normal respiratory effort, no wheezing Cardiovascular system: regular rate, s1, s2 Gastrointestinal system: Soft, nondistended, positive BS Central nervous system: CN2-12 grossly intact, strength intact Extremities: Perfused, no clubbing Skin: Normal skin turgor, no notable skin lesions seen Psychiatry: Mood normal // no visual hallucinations   Data Reviewed:  Labs  reviewed: Na 130, k 3.7, Cr 6.10, Hgb 7.0  Family Communication: Pt in room, family not at bedside  Disposition: Status is: Inpatient Remains inpatient appropriate because: Severity of illness  Planned Discharge Destination: Home    Author: Marylu Lund, MD 05/16/2022 4:01 PM  For on call review [email or web address].

## 2022-05-16 NOTE — Progress Notes (Signed)
[facility] Progress Note  Name: Wendy Randolph MRN: [id number] DOB: June 18, 1955  Chief Complaint:  Weakness and n/v/d  Subjective:  She feels well today.  She had just 25 ml UOP charted over 2/24.  Last HD on 2/24 with 1.5 kg UF.  She and I discussed risks/benefits/indications for renal biopsy again and she reconfirmed that consents for the procedure.  She is ok with [place] going ahead to start the process to look for an outpatient dialysis unit and understands that we are still watching in the hospital for renal recovery.  She and I discussed the risks/benefits/indications for blood transfusion and she does consent to blood.    Review of systems:      Denies shortness of breath  Denies chest pain  Denies n/v.   No longer having diarrhea.  No more Abdominal pain.    -------------- Background on consult:  HPI: The patient is a 67 y.o. year-old w/ PMH as below who presented to ED for gen weakness, fatigue and N/V/ D for the last 10 days. Unable to take her prescription medications. No hx liver/ kidney problems. No sick contacts. In ED BP's were wnl, HR 90-110, RR 13, afebrile. Labs showed BUN 104, creat 13.8 (Cr on Mar 26, 2022 was 0.65). Also Tbili is 20.0 and AST 608, ALT 913, alk phos 770, alb 3.0.  glucose 103. Pt rec'd IV zofran, 2 L bolus NS and will be admitted. We are asked to see for renal failure.  In CE pt saw per PCP w/ [facility] on 03/26/22 for HTN on norvasc, GERD, Fe def anemia, mixed HL, pre diabetes, weight loss, acute R back pain and vit D deficiency.  She was taking a lot of cold syrups and pills, not sure about nsaids.  Had dizziness and lightheadedness which is better here since getting IVF's in ED.  No hx of kidney failure.   Supplemental hx following AM: She shares that she was taking tylenol and ibuprofen every 8 hours for a stretch of time.  She feels like she has to urinate but can't.  She got on the toilet just now with assistance and had some bright  red blood per rectum and minimal urine output but still feels like she needs to urinate.  The patient states that she did have some blood at home on the toilet paper, as well but not in the toilet.     Intake/Output Summary (Last 24 hours) at 05/16/2022 0548 Last data filed at 05/15/2022 1554 Gross per 24 hour  Intake 480 ml  Output 1525 ml  Net -1045 ml    Vitals:  Vitals:   05/15/22 1800 05/15/22 1940 05/16/22 0159 05/16/22 0447  BP:  128/60 128/63   Pulse: (!) 103 (!) 105 90 90  Resp: '18 17 15 13  '$ Temp:  99.4 F (37.4 C) 98.1 F (36.7 C)   TempSrc:  Oral Oral   SpO2: 97% 99% 95% 98%  Weight:    63.1 kg  Height:         Physical Exam:      General adult female in bed in no acute distress HEENT normocephalic atraumatic extraocular movements intact sclera are a bit icteric Neck supple trachea midline Lungs clear to auscultation bilaterally normal work of breathing at rest on room air Heart S1S2 no rub Abdomen soft nondistended; nontender Extremities no edema  Psych normal mood and affect Neuro alert and oriented x3 provides hx and follows commands  GU - no foley  Access RIJ tunneled catheter in place   Medications reviewed   Labs:     Latest Ref Rng & Units 05/16/2022   12:21 AM 05/15/2022   12:31 AM 05/14/2022   12:28 AM  BMP  Glucose 70 - 99 mg/dL 110  129  112   BUN 8 - 23 mg/dL 25  54  44   Creatinine 0.44 - 1.00 mg/dL 6.10  9.53  8.62   Sodium 135 - 145 mmol/L 130  131  130   Potassium 3.5 - 5.1 mmol/L 3.7  3.8  3.9   Chloride 98 - 111 mmol/L 95  97  96   CO2 22 - 32 mmol/L '25  20  22   '$ Calcium 8.9 - 10.3 mg/dL 7.9  8.4  8.3     Last creat 0.65 from 03/26/22 in [facility] [place]     CT abd wo contrast - normal appearing kidneys bilat w/o any hydronephrosis   Assessment/Plan:   AKI - oligoanuric  - May be secondary to prolonged pre-renal insults with n/v/d and prolonged viral illness in the setting of ARB as well as limited number of  NSAID's.  Note ANA is positive but dsDNA < 1.  BL Cr 0.65 from 03/26/22.  UA is unremarkable and CT abd shows no obstruction.  Thankfully transaminitis is improved slightly - hopefully renal function will follow.  She was hydrated as felt to be dry on exam. UA with large blood but no RBC; CK was mildly elevated and has now normalized.  ANCA panel negative.  Complement normal. Tunneled catheter placed 2/20 with IR and first HD session 2/20.  - Assess dialysis needs daily.  For now HD per a TTS schedule.  Next HD on 2/27    - I have consulted IR for a renal biopsy on Monday, 2/26.  Stain for amyloid - NPO after 12:01 am on 05/17/22 for the renal biopsy - Check strict ins/outs - Will need to contact HD SW to initiate CLIP process for an outpatient dialysis unit as an AKI   Metabolic acidosis - previously refractory to bicarb; on HD  Transaminitis, jaundice/ hyperbilirubinemia - transaminitis and hyperbilirubinemia are improving thankfully.  Hepatitis A, B and C non-reactive.  She had two tylenol over the course of her illness so not excessive intake.  Per primary team.  GI was consulted and has signed off  Nausea/vomiting/diarrhea - note GI has been consulted.  Supportive care per primary team.  Patient is noted to be C dif positive but later is interpreted as indeterminate.  GI indicates not c dif.    Positive ANA - noted as above.  She denies a known diagnosis of lupus.  Note that complement is normal.  dsDNA is less than 1  Elevated free light chains - slightly elevated free light chain ratio.  May be secondary to renal failure.  SPEP without M spike but UPEP with an M spike.  We are getting a renal biopsy as above.  Would benefit from outpatient hem/onc evaluation if no plasma cell dyscrasia noted on renal biopsy   GI bleed - patient with blood per rectum on arrival.  RN noted hemorrhoids on exam.  GI was consulted and recommends outpatient follow-up for the same (she is known to [facility] per  GI charting).   HTN - Controlled and specifically avoiding arb/ ACEi's at this time given AKI.  Avoid hypotension   Normocytic Anemia - iron is acceptable.  Note blood per rectum and  GI is following.  Increased aranesp to 60 mcg every Thursday.  Mild thrombocytopenia resolved.  PRBC's today.    Disposition - please continue inpatient monitoring    Claudia Desanctis, MD 05/16/2022 6:11 AM

## 2022-05-17 ENCOUNTER — Inpatient Hospital Stay (HOSPITAL_COMMUNITY): Payer: Medicare Other

## 2022-05-17 LAB — CBC
HCT: 23.5 % — ABNORMAL LOW (ref 36.0–46.0)
Hemoglobin: 7.6 g/dL — ABNORMAL LOW (ref 12.0–15.0)
MCH: 30 pg (ref 26.0–34.0)
MCHC: 32.3 g/dL (ref 30.0–36.0)
MCV: 92.9 fL (ref 80.0–100.0)
Platelets: 356 10*3/uL (ref 150–400)
RBC: 2.53 MIL/uL — ABNORMAL LOW (ref 3.87–5.11)
RDW: 14.6 % (ref 11.5–15.5)
WBC: 12.3 10*3/uL — ABNORMAL HIGH (ref 4.0–10.5)
nRBC: 0.2 % (ref 0.0–0.2)

## 2022-05-17 LAB — PROTIME-INR
INR: 0.9 (ref 0.8–1.2)
Prothrombin Time: 12.1 seconds (ref 11.4–15.2)

## 2022-05-17 LAB — RENAL FUNCTION PANEL
Albumin: 2.3 g/dL — ABNORMAL LOW (ref 3.5–5.0)
Anion gap: 12 (ref 5–15)
BUN: 36 mg/dL — ABNORMAL HIGH (ref 8–23)
CO2: 23 mmol/L (ref 22–32)
Calcium: 8.2 mg/dL — ABNORMAL LOW (ref 8.9–10.3)
Chloride: 95 mmol/L — ABNORMAL LOW (ref 98–111)
Creatinine, Ser: 7.48 mg/dL — ABNORMAL HIGH (ref 0.44–1.00)
GFR, Estimated: 6 mL/min — ABNORMAL LOW (ref >60)
Glucose, Bld: 106 mg/dL — ABNORMAL HIGH (ref 70–99)
Phosphorus: 5.3 mg/dL — ABNORMAL HIGH (ref 2.5–4.6)
Potassium: 3.8 mmol/L (ref 3.5–5.1)
Sodium: 130 mmol/L — ABNORMAL LOW (ref 135–145)

## 2022-05-17 LAB — TYPE AND SCREEN
ABO/RH(D): O POS
Antibody Screen: NEGATIVE
Unit division: 0

## 2022-05-17 LAB — BPAM RBC
Blood Product Expiration Date: 202403272359
ISSUE DATE / TIME: 202402250854
Unit Type and Rh: 5100

## 2022-05-17 MED ORDER — MIDAZOLAM HCL 2 MG/2ML IJ SOLN
INTRAMUSCULAR | Status: AC | PRN
  Administered 2022-05-17: 1 mg via INTRAVENOUS

## 2022-05-17 MED ORDER — MIDAZOLAM HCL 2 MG/2ML IJ SOLN
INTRAMUSCULAR | Status: AC
  Filled 2022-05-17: qty 2

## 2022-05-17 MED ORDER — LIDOCAINE HCL (PF) 1 % IJ SOLN
10.0000 mL | Freq: Once | INTRAMUSCULAR | Status: AC
  Administered 2022-05-17: 10 mL via INTRADERMAL

## 2022-05-17 MED ORDER — FENTANYL CITRATE (PF) 100 MCG/2ML IJ SOLN
INTRAMUSCULAR | Status: AC | PRN
  Administered 2022-05-17: 50 ug via INTRAVENOUS

## 2022-05-17 MED ORDER — GELATIN ABSORBABLE 12-7 MM EX MISC
CUTANEOUS | Status: AC
  Filled 2022-05-17: qty 1

## 2022-05-17 MED ORDER — LIDOCAINE HCL (PF) 1 % IJ SOLN
INTRAMUSCULAR | Status: AC
  Filled 2022-05-17: qty 30

## 2022-05-17 MED ORDER — FENTANYL CITRATE (PF) 100 MCG/2ML IJ SOLN
INTRAMUSCULAR | Status: AC
  Filled 2022-05-17: qty 2

## 2022-05-17 MED ORDER — GELATIN ABSORBABLE 12-7 MM EX MISC
1.0000 | Freq: Once | CUTANEOUS | Status: AC
  Administered 2022-05-17: 1 via TOPICAL
  Filled 2022-05-17: qty 1

## 2022-05-17 MED ORDER — CHLORHEXIDINE GLUCONATE CLOTH 2 % EX PADS: 6.0000 | MEDICATED_PAD | Freq: Every day | CUTANEOUS | Status: DC

## 2022-05-17 NOTE — Plan of Care (Signed)

## 2022-05-17 NOTE — TOC Progression Note (Addendum)
Transition of Care [facility] - Progression Note    Patient Details  Name: Ezmae Limbert MRN: [id number] Date of Birth: 09-04-1955  Transition of Care [facility]) CM/SW [place], RN Phone Number: 05/17/2022, 3:24 PM  Clinical Narrative:    Spoke to patient at bedside regarding transition needs.  Patient offered choice. Patient defers to this RNCM to find highly rated agency.  Cory with [facility] accepted referral.  Patient's DME has already been delivered.  Patient waiting out patient dialysis to be arranged.  TOC following.   Address, Phone number and PCP verified [facility] in [place])  Need orders for Home health.   Expected Discharge Plan: [place] Barriers to Discharge: Continued Medical Work up  Expected Discharge Plan and Services   Discharge Planning Services: CM Consult Post Acute Care Choice: Home Health, Dialysis Living arrangements for the past 2 months: Single Family Home                                       Social Determinants of Health (SDOH) Interventions SDOH Screenings   Tobacco Use: High Risk (05/11/2022)    Readmission Risk Interventions     No data to display

## 2022-05-17 NOTE — Plan of Care (Signed)
  Problem: Health Behavior/Discharge Planning: Goal: Ability to manage health-related needs will improve Outcome: Progressing   Problem: Clinical Measurements: Goal: Ability to maintain clinical measurements within normal limits will improve Outcome: Progressing Goal: Will remain free from infection Outcome: Progressing Goal: Respiratory complications will improve Outcome: Progressing Goal: Cardiovascular complication will be avoided Outcome: Progressing   Problem: Activity: Goal: Risk for activity intolerance will decrease Outcome: Progressing   Problem: Elimination: Goal: Will not experience complications related to bowel motility Outcome: Progressing Goal: Will not experience complications related to urinary retention Outcome: Progressing   Problem: Pain Managment: Goal: General experience of comfort will improve Outcome: Progressing   Problem: Safety: Goal: Ability to remain free from injury will improve Outcome: Progressing   Problem: Skin Integrity: Goal: Risk for impaired skin integrity will decrease Outcome: Progressing

## 2022-05-17 NOTE — Progress Notes (Signed)
[facility] NEPHROLOGY PROGRESS NOTE  Assessment/ Plan: Pt is a 66 y.o. yo female with hypertension, HLD tobacco use presented with nausea vomiting and diarrhea found to have AKI, elevated liver enzymes.  # Acute kidney injury, dialysis dependent: Secondary to prolonged prerenal insult with nausea vomiting diarrhea and prolonged viral illness in the setting of concomitant use of ARB, NSAIDs probably leading to ATN.  UA with 3.17 g proteinuria, no hematuria.  Serologies unremarkable CT scan showed kidney cyst but no hydronephrosis. Tunneled HD catheter was placed on 2/20 by IR and received first HD on the same day. Noted plan for IR guided kidney biopsy today. We will plan to do dialysis tomorrow as per TTS schedule. I will contact renal navigator to arrange outpatient HD for AKI.  # Transaminitis/hyperbilirubinemia seen by GI, liver enzymes and bilirubin trending down.  # Metabolic acidosis improved.  # Hyponatremia due to decreased free water excretion with AKI.  UF with HD.  # HTN/volume: Blood pressure acceptable, continue current antihypertensives.   Subjective: Seen and examined at bedside.  Patient is n.p.o. for possible kidney biopsy today.  She denies nausea, vomiting, chest pain, shortness of breath.  Urine output recorded around 675 cc. Objective Vital signs in last 24 hours: Vitals:   05/16/22 2325 05/17/22 0329 05/17/22 0615 05/17/22 0741  BP: 138/65 139/69  133/76  Pulse: 95 84  82  Resp: '14 15  17  '$ Temp: 98.1 F (36.7 C) 98.2 F (36.8 C)  98 F (36.7 C)  TempSrc: Oral Oral  Oral  SpO2: 97% 95%  99%  Weight:   63.3 kg   Height:       Weight change: -1.3 kg  Intake/Output Summary (Last 24 hours) at 05/17/2022 0759 Last data filed at 05/17/2022 0617 Gross per 24 hour  Intake 908.33 ml  Output 675 ml  Net 233.33 ml       Labs: RENAL PANEL Recent Labs    05/09/22 1334 05/10/22 0038 05/11/22 0035 05/12/22 0024 05/13/22 0016  05/14/22 0028 05/15/22 0031 05/16/22 0021 05/17/22 0022  NA 132* 133* 133* 133* 131* 130* 131* 130* 130*  K 4.2 4.3 4.0 3.5 3.0* 3.9 3.8 3.7 3.8  CL 99 103 103 97* 95* 96* 97* 95* 95*  CO2 15* 11* 12* 18* 25 22 20* 25 23  GLUCOSE 103* 77 92 89 100* 112* 129* 110* 106*  BUN 104* 107* 107* 66* 30* 44* 54* 25* 36*  CREATININE 13.89* 14.63* 14.31* 10.50* 7.31* 8.62* 9.53* 6.10* 7.48*  CALCIUM 8.9 8.4* 9.0 8.4* 8.0* 8.3* 8.4* 7.9* 8.2*  MG  --  1.7 1.7  --   --   --   --   --   --   PHOS  --   --  5.7* 4.8*  --   --   --  3.9 5.3*  ALBUMIN 3.0* 2.4* 2.2* 2.3* 2.1* 2.3* 2.4* 2.3* 2.3*     Liver Function Tests: Recent Labs  Lab 05/13/22 0016 05/14/22 0028 05/15/22 0031 05/16/22 0021 05/17/22 0022  AST 79* 59* 43*  --   --   ALT 140* 74* 42  --   --   ALKPHOS 491* 426* 382*  --   --   BILITOT 2.7* 2.4* 2.2*  --   --   PROT 5.7* 6.1* 6.2*  --   --   ALBUMIN 2.1* 2.3* 2.4* 2.3* 2.3*   Recent Labs  Lab 05/12/22 0024  LIPASE 131*   No results for input(s): "AMMONIA"  in the last 168 hours. CBC: Recent Labs    05/10/22 0020 05/10/22 0038 05/11/22 0035 05/11/22 0522 05/13/22 0016 05/14/22 0028 05/16/22 0021 05/16/22 1542 05/17/22 0022  HGB  --    < > 8.7*   < > 7.2* 7.5* 7.0* 8.6* 7.6*  MCV  --    < > 90.6   < > 90.3 95.6 92.9  --  92.9  VITAMINB12  --   --  5,448*  --   --   --   --   --   --   FOLATE  --   --  14.6  --   --   --   --   --   --   FERRITIN >7,500*  --  >7,500*  --   --   --   --   --   --   TIBC 245*  --  248*  --   --   --   --   --   --   IRON 89  --  66  --   --   --   --   --   --   RETICCTPCT  --   --  2.1  --   --   --   --   --   --    < > = values in this interval not displayed.    Cardiac Enzymes: Recent Labs  Lab 05/10/22 1413 05/11/22 0035  CKTOTAL 241* 172   CBG: No results for input(s): "GLUCAP" in the last 168 hours.  Iron Studies: No results for input(s): "IRON", "TIBC", "TRANSFERRIN", "FERRITIN" in the last 72  hours. Studies/Results: No results found.  Medications: Infusions:   Scheduled Medications:  amLODipine  5 mg Oral Daily   Chlorhexidine Gluconate Cloth  6 each Topical Q0600   [START ON 05/20/2022] darbepoetin (ARANESP) injection - NON-DIALYSIS  60 mcg Subcutaneous Q Thu-1800   Gerhardt's butt cream   Topical QID   lidocaine  1 patch Transdermal Q24H   loperamide  2 mg Oral Daily    have reviewed scheduled and prn medications.  Physical Exam: General:NAD, comfortable Heart:RRR, s1s2 nl Lungs:clear b/l, no crackle Abdomen:soft, Non-tender, non-distended Extremities:No edema Dialysis Access: Right IJ TDC in place  Keymora Grillot Prasad Ugo Thoma 05/17/2022,7:59 AM  LOS: 8 days

## 2022-05-17 NOTE — Procedures (Signed)
Interventional Radiology Procedure Note  Procedure: US Guided Biopsy of left kidney  Complications: None  Estimated Blood Loss: < 10 mL  Findings: 16 G core biopsy of left lower pole renal cortex performed under US guidance.  Two core samples obtained and sent to Pathology.  Venetia Night. Kathlene Cote, M.D Pager:  [phone]

## 2022-05-17 NOTE — Progress Notes (Signed)
Progress Note   Patient: Wendy Randolph [id number] DOB: 31-Dec-1955 DOA: 05/09/2022     8 DOS: the patient was seen and examined on 05/17/2022   Brief hospital course: 67 year old F with PMH of HTN, HLD and tobacco use disorder presenting with nausea, vomiting and diarrhea for about a week and admitted for severe acute renal failure, elevated liver enzymes with markedly elevated bilirubin and lipase likely due to dehydration from GI loss in the setting of possible viral illness with concurrent NSAID use and ARB.   In ED, she had a stable vitals. Cr 14.  BUN 104.  Bicarb 15.  AST 608.  ALT 913.  ALP 770.  Total bili 20.  Direct bili 16.2.  Lipase 1118.  CT abdomen and pelvis without acute finding.  RUQ US demonstrated gallbladder sludge with CBD measuring 0.5 cm.  Patient was started on IV fluid and transferred to [facility] for further care after consultation with GI and nephrology.   [place] placed on 2/20.  HD started.  LFT improved.  Ongoing diarrhea.  Inflammatory markers elevated.  GI had been following.  Assessment and Plan: Acute renal failure/high anion gap metabolic acidosis/hyponatremia: Baseline Cr 0.65.  AKI likely prerenal insult from dehydration due to GI loss: Concurrent use of NSAID and ARB.  CT A/P without obstruction.  UA without significant finding. -TDC placed and HD started on 2/20. -Nephrology following.  Autoimmune workup initiated per Nephrology -Pt now s/p renal biopsy 2/26 -Cont with HD per nephro. Now being clipped   Elevated liver enzymes/hyperbilirubinemia:  -Seems to be improving.  CT abdomen and pelvis and RUQ [place] without significant finding other than some biliary sludge.  Acute hepatitis panel and HIV nonreactive. -GI had been following, now signed off  as of 2/22 -has been tolerating diet well   Elevated lipase:  -No radiologic evidence of pancreatitis or biliary stone.   -Denies drinking alcohol. -no abd pain   Infectious diarrhea:  -Tenderness improved as  well. C. difficile testing and GIP negative.  Inflammatory markers elevated. -Follow stool chemistry and calprotectin. -Discontinued enteric precaution -GI had been following, now signed off. -Diarrhea resolved, continues to tolerate regular diet   Essential hypertension: BP slightly  -Resume home amlodipine -Continue IV labetalol as needed -Continue holding losartan -Cont HD as tolerated per Nephrology   GI bleed/normocytic anemia: RN staff noted some darker red stool this morning.  Drop in Hgb likely dilutional -Pt remains hemodynamically stable -Hgb up ot 7.6 pos 1 unit PRBC transfusion 2/25   Mixed hyperlipidemia -Hold statin due to elevated liver enzymes   Leukocytosis/bandemia:  -Afebrile  -Hemodynamically stable. -blood cx neg  Hypokalemia -normalized      Subjective: Feeling nervous about biopsy tomorrow  Physical Exam: Vitals:   05/17/22 0950 05/17/22 1000 05/17/22 1034 05/17/22 1233  BP: (!) 148/76 (!) 157/80 139/76 126/63  Pulse: 86 91 88   Resp: '14 17 18   '$ Temp:   (!) 97.5 F (36.4 C)   TempSrc:   Oral   SpO2: 100% 100% 95%   Weight:      Height:       General exam: Conversant, in no acute distress Respiratory system: normal chest rise, clear, no audible wheezing Cardiovascular system: regular rhythm, s1-s2 Gastrointestinal system: Nondistended, nontender, pos BS Central nervous system: No seizures, no tremors Extremities: No cyanosis, no joint deformities Skin: No rashes, no pallor Psychiatry: Affect normal // no auditory hallucinations   Data Reviewed:  Labs reviewed: Na 130, k 3.8, Cr 7.48, Hgb  7.6  Family Communication: Pt in room, family not at bedside  Disposition: Status is: Inpatient Remains inpatient appropriate because: Severity of illness  Planned Discharge Destination: Home    Author: Marylu Lund, MD 05/17/2022 1:28 PM  For on call review [email or web address].

## 2022-05-17 NOTE — Progress Notes (Signed)
PT Cancellation Note  Patient Details Name: Wendy Randolph MRN: [id number] DOB: 05/23/1955   Cancelled Treatment:    Reason Eval/Treat Not Completed: Active bedrest order   Sandy Salaam Aundrea Horace 05/17/2022, 11:23 AM [place] Office: [phone]

## 2022-05-18 LAB — CBC
HCT: 24.9 % — ABNORMAL LOW (ref 36.0–46.0)
Hemoglobin: 8 g/dL — ABNORMAL LOW (ref 12.0–15.0)
MCH: 30.1 pg (ref 26.0–34.0)
MCHC: 32.1 g/dL (ref 30.0–36.0)
MCV: 93.6 fL (ref 80.0–100.0)
Platelets: 421 10*3/uL — ABNORMAL HIGH (ref 150–400)
RBC: 2.66 MIL/uL — ABNORMAL LOW (ref 3.87–5.11)
RDW: 14.6 % (ref 11.5–15.5)
WBC: 13.8 10*3/uL — ABNORMAL HIGH (ref 4.0–10.5)
nRBC: 0 % (ref 0.0–0.2)

## 2022-05-18 LAB — RENAL FUNCTION PANEL
Albumin: 2.4 g/dL — ABNORMAL LOW (ref 3.5–5.0)
Anion gap: 13 (ref 5–15)
BUN: 42 mg/dL — ABNORMAL HIGH (ref 8–23)
CO2: 23 mmol/L (ref 22–32)
Calcium: 8.3 mg/dL — ABNORMAL LOW (ref 8.9–10.3)
Chloride: 95 mmol/L — ABNORMAL LOW (ref 98–111)
Creatinine, Ser: 8.72 mg/dL — ABNORMAL HIGH (ref 0.44–1.00)
GFR, Estimated: 5 mL/min — ABNORMAL LOW (ref >60)
Glucose, Bld: 116 mg/dL — ABNORMAL HIGH (ref 70–99)
Phosphorus: 6.3 mg/dL — ABNORMAL HIGH (ref 2.5–4.6)
Potassium: 3.8 mmol/L (ref 3.5–5.1)
Sodium: 131 mmol/L — ABNORMAL LOW (ref 135–145)

## 2022-05-18 MED ORDER — ALTEPLASE 2 MG IJ SOLR: 2.0000 mg | Freq: Once | INTRAMUSCULAR | Status: DC | PRN

## 2022-05-18 MED ORDER — HEPARIN SODIUM (PORCINE) 1000 UNIT/ML IJ SOLN
INTRAMUSCULAR | Status: AC
  Filled 2022-05-18: qty 4

## 2022-05-18 MED ORDER — HEPARIN SODIUM (PORCINE) 1000 UNIT/ML IJ SOLN
INTRAMUSCULAR | Status: AC
  Administered 2022-05-18: 1300 [IU] via INTRAVENOUS_CENTRAL
  Filled 2022-05-18: qty 2

## 2022-05-18 MED ORDER — HEPARIN SODIUM (PORCINE) 1000 UNIT/ML IJ SOLN
INTRAMUSCULAR | Status: AC
  Administered 2022-05-18: 3200 [IU]
  Filled 2022-05-18: qty 4

## 2022-05-18 MED ORDER — ANTICOAGULANT SODIUM CITRATE 4% (200MG/5ML) IV SOLN
5.0000 mL | Status: DC | PRN
  Filled 2022-05-18: qty 5

## 2022-05-18 MED ORDER — HEPARIN SODIUM (PORCINE) 1000 UNIT/ML DIALYSIS: 1000.0000 [IU] | INTRAMUSCULAR | Status: DC | PRN

## 2022-05-18 MED ORDER — HEPARIN SODIUM (PORCINE) 1000 UNIT/ML DIALYSIS: 20.0000 [IU]/kg | INTRAMUSCULAR | Status: DC | PRN

## 2022-05-18 MED ORDER — SEVELAMER CARBONATE 800 MG PO TABS
800.0000 mg | ORAL_TABLET | Freq: Three times a day (TID) | ORAL | Status: DC
  Administered 2022-05-18 – 2022-05-20 (×6): 800 mg via ORAL
  Filled 2022-05-18 (×6): qty 1

## 2022-05-18 NOTE — Plan of Care (Signed)
[facility] called me with the prelim renal biopsy read.  I have called Dr. Carolin Sicks with the information as well.   The biopsy was quite deep.  There was only 25% cortex.  2 glomeruli were present on the light sample.  There was severe tubular injury and pigmented casts. (This stained negative for Hb and negative for myoglobin and I have asked that they stain for bilirubin. It did not seem to be bilirubin per the pathologist.)  [place] red stain was negative.    There was mild fibrosis but again a very small sample so hard to tell.    Await final read - they will call me back tomorrow  Claudia Desanctis, MD 4:55 PM 05/18/2022

## 2022-05-18 NOTE — Progress Notes (Signed)
   05/18/22 1230  Vitals  Pulse Rate 75  Resp 14  BP (!) 186/91  SpO2 95 %  O2 Device Room Air  Type of Weight Post-Dialysis  Oxygen Therapy  Patient Activity (if Appropriate) In bed  Pulse Oximetry Type Continuous  Oximetry Probe Site Changed No  Post Treatment  Dialyzer Clearance Lightly streaked  Duration of HD Treatment -hour(s) 3.5 hour(s)  Hemodialysis Intake (mL) 0 mL  Liters Processed 74.7  Fluid Removed (mL) 1800 mL  Tolerated HD Treatment Yes  Post-Hemodialysis Comments see progress note in regards to increased BP   Received patient in bed to unit.  Alert and oriented.  Informed consent signed and in chart.   [place] duration:3.5  Patient tolerated well.  Transported back to the room  Alert, without acute distress.  Hand-off given to patient's nurse.   Access used: [facility] Access issues: multiple need for swithing--reversing line due to positional flow  Total UF removed: 1800 Medication(s) given: laebatolol 60m iv x 1 prn [facility]

## 2022-05-18 NOTE — Procedures (Signed)
Patient was seen on dialysis and the procedure was supervised.  BFR 400  Via TDC BP is  175/78.   Patient appears to be tolerating treatment well.  Wendy Randolph Tanna Furry 05/18/2022

## 2022-05-18 NOTE — Progress Notes (Signed)
Progress Note   Patient: Wendy Randolph [id number] DOB: 1955/07/15 DOA: 05/09/2022     9 DOS: the patient was seen and examined on 05/18/2022   Brief hospital course: 66 year old F with PMH of HTN, HLD and tobacco use disorder presenting with nausea, vomiting and diarrhea for about a week and admitted for severe acute renal failure, elevated liver enzymes with markedly elevated bilirubin and lipase likely due to dehydration from GI loss in the setting of possible viral illness with concurrent NSAID use and ARB.   In ED, she had a stable vitals. Cr 14.  BUN 104.  Bicarb 15.  AST 608.  ALT 913.  ALP 770.  Total bili 20.  Direct bili 16.2.  Lipase 1118.  CT abdomen and pelvis without acute finding.  RUQ US demonstrated gallbladder sludge with CBD measuring 0.5 cm.  Patient was started on IV fluid and transferred to [facility] for further care after consultation with GI and nephrology.   [place] placed on 2/20.  HD started.  LFT improved.  Ongoing diarrhea.  Inflammatory markers elevated.  GI had been following.  Assessment and Plan: Acute renal failure/high anion gap metabolic acidosis/hyponatremia: Baseline Cr 0.65.  AKI likely prerenal insult from dehydration due to GI loss: Concurrent use of NSAID and ARB.  CT A/P without obstruction.  UA without significant finding. -TDC placed and HD started on 2/20. -Nephrology following.  Autoimmune workup initiated per Nephrology -Pt now s/p renal biopsy 2/26 -Tolerating HD thus far. Outpt HD is being arranged.   Elevated liver enzymes/hyperbilirubinemia:  -Seems to be improving.  CT abdomen and pelvis and RUQ [place] without significant finding other than some biliary sludge.  Acute hepatitis panel and HIV nonreactive. -GI had been following, now signed off  as of 2/22 -has been tolerating diet well   Elevated lipase:  -No radiologic evidence of pancreatitis or biliary stone.   -Denies drinking alcohol. -no abd pain   Infectious diarrhea:  -Tenderness  improved as well. C. difficile testing and GIP negative.  Inflammatory markers elevated. -Discontinued enteric precaution -GI had been following, now signed off. -Diarrhea resolved, continues to tolerate regular diet   Essential hypertension:  -Resumed home amlodipine -Continue IV labetalol as needed -Continue holding losartan -Cont HD as tolerated per Nephrology   GI bleed/normocytic anemia:  -Pt remains hemodynamically stable -Pt is s/p 1 unit PRBC transfusion 2/25 for gradual downtrend in hgb down to 7.0 -Hgb thus far remains stable   Mixed hyperlipidemia -Hold statin due to elevated liver enzymes   Leukocytosis/bandemia:  -Afebrile  -Hemodynamically stable. -blood cx neg  Hypokalemia -normalized      Subjective: Seen on HD this AM. Without complaints  Physical Exam: Vitals:   05/18/22 1200 05/18/22 1230 05/18/22 1347 05/18/22 1352  BP: (!) 178/88 (!) 186/91 (!) 187/83 (!) 187/83  Pulse: 69 75 86   Resp: '14 14 16   '$ Temp:  98.1 F (36.7 C) 97.8 F (36.6 C)   TempSrc:   Oral   SpO2: 95% 95% 94%   Weight:      Height:       General exam: Conversant, in no acute distress Respiratory system: normal chest rise, clear, no audible wheezing Cardiovascular system: regular rhythm, s1-s2 Gastrointestinal system: Nondistended, nontender, pos BS Central nervous system: No seizures, no tremors Extremities: No cyanosis, no joint deformities Skin: No rashes, no pallor Psychiatry: Affect normal // no auditory hallucinations   Data Reviewed:  Labs reviewed: Na 131, K 3.8, Cr 8.72, WBC 13.8, Hgb 8.0  Family Communication: Pt in room, family not at bedside  Disposition: Status is: Inpatient Remains inpatient appropriate because: Severity of illness  Planned Discharge Destination: Home    Author: Marylu Lund, MD 05/18/2022 2:05 PM  For on call review [email or web address].

## 2022-05-18 NOTE — Progress Notes (Signed)
RN received a call from HD nurse about patients BP being 193/86 requested for labetalol to be sent up by tube. RN tubed up labetalol and confirmed with RN medication was received.

## 2022-05-18 NOTE — Plan of Care (Signed)
  Problem: Health Behavior/Discharge Planning: Goal: Ability to manage health-related needs will improve Outcome: Progressing   Problem: Clinical Measurements: Goal: Ability to maintain clinical measurements within normal limits will improve Outcome: Progressing Goal: Will remain free from infection Outcome: Progressing Goal: Diagnostic test results will improve Outcome: Progressing Goal: Respiratory complications will improve Outcome: Progressing Goal: Cardiovascular complication will be avoided Outcome: Progressing   Problem: Pain Managment: Goal: General experience of comfort will improve Outcome: Progressing   Problem: Safety: Goal: Ability to remain free from injury will improve Outcome: Progressing   Problem: Skin Integrity: Goal: Risk for impaired skin integrity will decrease Outcome: Progressing

## 2022-05-18 NOTE — Progress Notes (Signed)
Requested to see pt for out-pt HD at d/c. Met with pt at bedside. Introduced and explained role. Pt lives in [place]. Discussed all out-pt HD options in the [facility] area. Pt voices interest in [facility]. Referral submitted to [facility] admissions this afternoon for review. Pt states that she has friends/family that can assist with transport to HD if needed. Will assist as needed.   Melven Sartorius Renal Navigator [phone]

## 2022-05-18 NOTE — Progress Notes (Signed)
PT Cancellation Note  Patient Details Name: Wendy Randolph MRN: [id number] DOB: 07-31-1955   Cancelled Treatment:    Reason Eval/Treat Not Completed: Patient at procedure or test/unavailable. Pt off the floor at HD. PT to return as able to progress mobility.  Kittie Plater, PT, DPT Acute Rehabilitation Services Secure chat preferred Office #: [phone]    Berline Lopes 05/18/2022, 8:36 AM

## 2022-05-18 NOTE — Plan of Care (Signed)

## 2022-05-18 NOTE — Progress Notes (Signed)
[facility] NEPHROLOGY PROGRESS NOTE  Assessment/ Plan: Pt is a 67 y.o. yo female with hypertension, HLD tobacco use presented with nausea vomiting and diarrhea found to have AKI, elevated liver enzymes.  # Acute kidney injury, dialysis dependent: Secondary to prolonged prerenal insult with nausea vomiting diarrhea and prolonged viral illness in the setting of concomitant use of ARB, NSAIDs probably leading to ATN.  UA with 3.17 g proteinuria, no hematuria.  Serologies unremarkable CT scan showed kidney cyst but no hydronephrosis. Tunneled HD catheter was placed on 2/20 by IR and received first HD on the same day. Status post kidney biopsy by IR on 2/26, awaiting the biopsy result. Patient is oliguric with no sign of renal recovery, plan for dialysis today. Discussed with renal navigator to arrange outpatient dialysis for AKI.  # Transaminitis/hyperbilirubinemia seen by GI, liver enzymes and bilirubin trending down.  # Metabolic acidosis improved.  # Hyponatremia due to decreased free water excretion with AKI.  UF with HD.  # HTN/volume: Blood pressure acceptable, continue current antihypertensives.  # Anemia, due to acute illness: Extremely high ferritin likely inflammatory marker.  Continue Aranesp.  # Hyperphosphatemia: Start sevelamer.   Subjective: Seen and examined at bedside.  Underwent kidney biopsy yesterday, no complication.  Patient reports clear urine, no back or flank pain.  No nausea, vomiting, chest pain or shortness breath.  Objective Vital signs in last 24 hours: Vitals:   05/18/22 0400 05/18/22 0830 05/18/22 0847 05/18/22 0900  BP: (!) 142/74 (!) 147/70 (!) 161/73 136/72  Pulse: 88 86 84 (!) 30  Resp: '15 16 16 15  '$ Temp:  98.8 F (37.1 C)    TempSrc:      SpO2: 96% 99% 96%   Weight:  63.3 kg    Height:       Weight change: 0 kg  Intake/Output Summary (Last 24 hours) at 05/18/2022 0914 Last data filed at 05/17/2022 1100 Gross per 24 hour   Intake --  Output 250 ml  Net -250 ml        Labs: RENAL PANEL Recent Labs    05/09/22 1334 05/10/22 0038 05/11/22 0035 05/12/22 0024 05/13/22 0016 05/14/22 0028 05/15/22 0031 05/16/22 0021 05/17/22 0022 05/18/22 0014  NA 132* 133* 133* 133* 131* 130* 131* 130* 130* 131*  K 4.2 4.3 4.0 3.5 3.0* 3.9 3.8 3.7 3.8 3.8  CL 99 103 103 97* 95* 96* 97* 95* 95* 95*  CO2 15* 11* 12* 18* 25 22 20* '25 23 23  '$ GLUCOSE 103* 77 92 89 100* 112* 129* 110* 106* 116*  BUN 104* 107* 107* 66* 30* 44* 54* 25* 36* 42*  CREATININE 13.89* 14.63* 14.31* 10.50* 7.31* 8.62* 9.53* 6.10* 7.48* 8.72*  CALCIUM 8.9 8.4* 9.0 8.4* 8.0* 8.3* 8.4* 7.9* 8.2* 8.3*  MG  --  1.7 1.7  --   --   --   --   --   --   --   PHOS  --   --  5.7* 4.8*  --   --   --  3.9 5.3* 6.3*  ALBUMIN 3.0* 2.4* 2.2* 2.3* 2.1* 2.3* 2.4* 2.3* 2.3* 2.4*      Liver Function Tests: Recent Labs  Lab 05/13/22 0016 05/14/22 0028 05/15/22 0031 05/16/22 0021 05/17/22 0022 05/18/22 0014  AST 79* 59* 43*  --   --   --   ALT 140* 74* 42  --   --   --   ALKPHOS 491* 426* 382*  --   --   --  BILITOT 2.7* 2.4* 2.2*  --   --   --   PROT 5.7* 6.1* 6.2*  --   --   --   ALBUMIN 2.1* 2.3* 2.4* 2.3* 2.3* 2.4*    Recent Labs  Lab 05/12/22 0024  LIPASE 131*    No results for input(s): "AMMONIA" in the last 168 hours. CBC: Recent Labs    05/10/22 0020 05/10/22 0038 05/11/22 0035 05/11/22 0522 05/14/22 0028 05/16/22 0021 05/16/22 1542 05/17/22 0022 05/18/22 0014  HGB  --    < > 8.7*   < > 7.5* 7.0* 8.6* 7.6* 8.0*  MCV  --    < > 90.6   < > 95.6 92.9  --  92.9 93.6  VITAMINB12  --   --  5,448*  --   --   --   --   --   --   FOLATE  --   --  14.6  --   --   --   --   --   --   FERRITIN >7,500*  --  >7,500*  --   --   --   --   --   --   TIBC 245*  --  248*  --   --   --   --   --   --   IRON 89  --  66  --   --   --   --   --   --   RETICCTPCT  --   --  2.1  --   --   --   --   --   --    < > = values in this interval not  displayed.     Cardiac Enzymes: No results for input(s): "CKTOTAL", "CKMB", "CKMBINDEX", "TROPONINI" in the last 168 hours.  CBG: No results for input(s): "GLUCAP" in the last 168 hours.  Iron Studies: No results for input(s): "IRON", "TIBC", "TRANSFERRIN", "FERRITIN" in the last 72 hours. Studies/Results: US BIOPSY (KIDNEY)  Result Date: 05/17/2022 INDICATION: Acute kidney injury, ATN, proteinuria and need for renal biopsy. EXAM: ULTRASOUND GUIDED CORE BIOPSY OF LEFT KIDNEY MEDICATIONS: None. ANESTHESIA/SEDATION: Moderate (conscious) sedation was employed during this procedure. A total of Versed 1.0 mg and Fentanyl 50 mcg was administered intravenously by radiology nursing. Moderate Sedation Time: 13 minutes. The patient's level of consciousness and vital signs were monitored continuously by radiology nursing throughout the procedure under my direct supervision. PROCEDURE: The procedure, risks, benefits, and alternatives were explained to the patient. Questions regarding the procedure were encouraged and answered. The patient understands and consents to the procedure. A time-out was performed prior to initiating the procedure. Ultrasound was performed in a prone position to localize both kidneys. The left flank region was prepped with chlorhexidine in a sterile fashion, and a sterile drape was applied covering the operative field. A sterile gown and sterile gloves were used for the procedure. Local anesthesia was provided with 1% Lidocaine. Under ultrasound guidance, a 15 gauge trocar needle was advanced to the margin of lower pole cortex of the left kidney. Two separate coaxial 16 gauge core biopsy samples were obtained and submitted in saline. A slurry of Gel-Foam pledgets was then injected via the 15 gauge needle as it was retracted and removed. Additional ultrasound was performed. COMPLICATIONS: None immediate. FINDINGS: Both kidneys were well visualized by ultrasound. The left kidney was chosen  for biopsy. Solid core biopsy samples were obtained. IMPRESSION: Ultrasound-guided core biopsy performed at the level of left  lower pole renal cortex. Electronically Signed   By: Aletta Edouard M.D.   On: 05/17/2022 11:59    Medications: Infusions:   Scheduled Medications:  amLODipine  5 mg Oral Daily   Chlorhexidine Gluconate Cloth  6 each Topical Q0600   [START ON 05/20/2022] darbepoetin (ARANESP) injection - NON-DIALYSIS  60 mcg Subcutaneous Q Thu-1800   Gerhardt's butt cream   Topical QID   heparin sodium (porcine)       lidocaine  1 patch Transdermal Q24H   loperamide  2 mg Oral Daily    have reviewed scheduled and prn medications.  Physical Exam: General:NAD, comfortable Heart:RRR, s1s2 nl Lungs:clear b/l, no crackle Abdomen:soft, Non-tender, non-distended Extremities:No edema Dialysis Access: Right IJ TDC in place  Nolyn Swab Prasad Uriel Horkey 05/18/2022,9:14 AM  LOS: 9 days

## 2022-05-19 DIAGNOSIS — N179 Acute kidney failure, unspecified: Secondary | ICD-10-CM | POA: Diagnosis not present

## 2022-05-19 LAB — RENAL FUNCTION PANEL
Albumin: 2.5 g/dL — ABNORMAL LOW (ref 3.5–5.0)
Anion gap: 10 (ref 5–15)
BUN: 27 mg/dL — ABNORMAL HIGH (ref 8–23)
CO2: 27 mmol/L (ref 22–32)
Calcium: 8.4 mg/dL — ABNORMAL LOW (ref 8.9–10.3)
Chloride: 96 mmol/L — ABNORMAL LOW (ref 98–111)
Creatinine, Ser: 5.91 mg/dL — ABNORMAL HIGH (ref 0.44–1.00)
GFR, Estimated: 7 mL/min — ABNORMAL LOW (ref >60)
Glucose, Bld: 116 mg/dL — ABNORMAL HIGH (ref 70–99)
Phosphorus: 5.1 mg/dL — ABNORMAL HIGH (ref 2.5–4.6)
Potassium: 3.3 mmol/L — ABNORMAL LOW (ref 3.5–5.1)
Sodium: 133 mmol/L — ABNORMAL LOW (ref 135–145)

## 2022-05-19 LAB — CBC
HCT: 24.1 % — ABNORMAL LOW (ref 36.0–46.0)
Hemoglobin: 8.1 g/dL — ABNORMAL LOW (ref 12.0–15.0)
MCH: 31.4 pg (ref 26.0–34.0)
MCHC: 33.6 g/dL (ref 30.0–36.0)
MCV: 93.4 fL (ref 80.0–100.0)
Platelets: 393 10*3/uL (ref 150–400)
RBC: 2.58 MIL/uL — ABNORMAL LOW (ref 3.87–5.11)
RDW: 14.5 % (ref 11.5–15.5)
WBC: 14 10*3/uL — ABNORMAL HIGH (ref 4.0–10.5)
nRBC: 0.1 % (ref 0.0–0.2)

## 2022-05-19 MED ORDER — POTASSIUM CHLORIDE CRYS ER 20 MEQ PO TBCR
20.0000 meq | EXTENDED_RELEASE_TABLET | Freq: Once | ORAL | Status: AC
  Administered 2022-05-19: 20 meq via ORAL
  Filled 2022-05-19: qty 1

## 2022-05-19 MED ORDER — CHLORHEXIDINE GLUCONATE CLOTH 2 % EX PADS: 6.0000 | MEDICATED_PAD | Freq: Every day | CUTANEOUS | Status: DC

## 2022-05-19 NOTE — Progress Notes (Signed)
Physical Therapy Treatment Patient Details Name: Ashana Goldfarb MRN: [id number] DOB: 02/12/1956 Today's Date: 05/19/2022   History of Present Illness 67 yo female admitted 2/18 with N/V/D and acute renal failure. 2/20 TDC placed and HD initiated. PMhx: HTN, HLD, perirectal abscess    PT Comments    Pt received in supine, agreeable to therapy session and with good participation and tolerance for transfer, gait and stair negotiation. Pt making progress toward goals, she trialed single point cane but pt was steadier using RW and she is still agreeable to RW DME upon DC. Pt needing up to minA for stair ascent with cane and mostly min guard for functional mobility tasks with 1-2 UE support. Pt continues to benefit from PT services to progress toward functional mobility goals.    Recommendations for follow up therapy are one component of a multi-disciplinary discharge planning process, led by the attending physician.  Recommendations may be updated based on patient status, additional functional criteria and insurance authorization.  Follow Up Recommendations  Home health PT     Assistance Recommended at Discharge    Patient can return home with the following A little help with walking and/or transfers;A little help with bathing/dressing/bathroom;Assistance with cooking/housework;Assist for transportation;Help with stairs or ramp for entrance   Equipment Recommendations  Rolling walker (2 wheels);BSC/3in1    Recommendations for Other Services       Precautions / Restrictions Precautions Precautions: Fall Precaution Comments: prev bowel incontinence Restrictions Weight Bearing Restrictions: No     Mobility  Bed Mobility Overal bed mobility: Needs Assistance Bed Mobility: Supine to Sit     Supine to sit: Supervision, HOB elevated     General bed mobility comments: supervision for safety and lines    Transfers Overall transfer level: Needs assistance Equipment used: Straight  cane Transfers: Sit to/from Stand Sit to Stand: Min guard, Supervision           General transfer comment: min guard for sit>stand, supervision for stand>sit to chair with arm rests    Ambulation/Gait Ambulation/Gait assistance: Min guard Gait Distance (Feet): 350 Feet Assistive device: Straight cane Gait Pattern/deviations: Step-through pattern, Decreased stride length, Drifts right/left       General Gait Details: mild instability and pt tends to drift to R side of hallway using cane, she states afterward that she feels better about taking RW home upon DC. Improving safety awareness and no overt LOB/buckling with use of cane. SpO2/HR WFL on RA.   Stairs Stairs: Yes Stairs assistance: Min guard, Min assist Stair Management: No rails, Step to pattern, Backwards, Forwards, With cane Number of Stairs: 2 (1+1 platform 7" in room) General stair comments: intially with cane and unsupported on opposite side, pt without severe LOB but minA for safety/stability, then using single rail and cane on next trial, pt more stable needing only min guard   Wheelchair Mobility    Modified Rankin (Stroke Patients Only)       Balance Overall balance assessment: Needs assistance Sitting-balance support: Feet supported, No upper extremity supported Sitting balance-Leahy Scale: Good     Standing balance support: Single extremity supported, During functional activity Standing balance-Leahy Scale: Fair                              Cognition Arousal/Alertness: Awake/alert Behavior During Therapy: WFL for tasks assessed/performed Overall Cognitive Status: Within Functional Limits for tasks assessed  General Comments: min safety/sequencing cues using cane, pt unfamiliar with device but follows instructions well        Exercises      General Comments General comments (skin integrity, edema, etc.): SpO2/HR WFL on RA       Pertinent Vitals/Pain Pain Assessment Pain Assessment: No/denies pain           PT Goals (current goals can now be found in the care plan section) Acute Rehab PT Goals 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 PT Goal Formulation: With patient Time For Goal Achievement: 05/27/22 Progress towards PT goals: Progressing toward goals    Frequency    Min 3X/week      PT Plan Current plan remains appropriate       AM-PAC PT "6 Clicks" Mobility   Outcome Measure  Help needed turning from your back to your side while in a flat bed without using bedrails?: None Help needed moving from lying on your back to sitting on the side of a flat bed without using bedrails?: A Little Help needed moving to and from a bed to a chair (including a wheelchair)?: A Little Help needed standing up from a chair using your arms (e.g., wheelchair or bedside chair)?: A Little Help needed to walk in hospital room?: A Little Help needed climbing 3-5 steps with a railing? : A Little 6 Click Score: 19    End of Session Equipment Utilized During Treatment: Gait belt Activity Tolerance: Patient tolerated treatment well Patient left: in chair;with call bell/phone within reach;with chair alarm set Nurse Communication: Mobility status PT Visit Diagnosis: Other abnormalities of gait and mobility (R26.89);Muscle weakness (generalized) (M62.81)     Time: 1202-1217 PT Time Calculation (min) (ACUTE ONLY): 15 min  Charges:  $Gait Training: 8-22 mins                     Aleysha Meckler P., PTA Acute Rehabilitation Services Secure Chat Preferred 9a-5:30pm Office: [place] 05/19/2022, 1:59 PM

## 2022-05-19 NOTE — Progress Notes (Signed)
[facility] PROGRESS NOTE  Assessment/ Plan: Pt is a 67 y.o. yo female with hypertension, HLD tobacco use presented with nausea vomiting and diarrhea found to have AKI, elevated liver enzymes.  # Acute kidney injury, dialysis dependent: Secondary to prolonged prerenal insult with nausea vomiting diarrhea and prolonged viral illness in the setting of concomitant use of ARB, NSAIDs probably leading to ATN.  UA with 3.17 g proteinuria, no hematuria.  Serologies unremarkable CT scan showed kidney cyst but no hydronephrosis. Tunneled HD catheter was placed on 2/20 by IR and received first HD on the same day. Status post kidney biopsy by IR on 2/26, the preliminary results showed only 25% cortex, severe tubular atrophy, pigment cast, Congo red stain was negative.  Awaiting final biopsy result. Patient is anuric with no sign of renal recovery therefore continue dialysis as TTS schedule. Discussed with renal navigator to arrange outpatient dialysis for AKI.  Ok to discharge once outpatient HD is arranged from renal perspective.  # Transaminitis/hyperbilirubinemia seen by GI, liver enzymes and bilirubin trending down.  # Metabolic acidosis improved.  # Hyponatremia due to decreased free water excretion with AKI.  UF with HD.  # HTN/volume: Blood pressure acceptable, continue current antihypertensives.  # Anemia, due to acute illness: Extremely high ferritin likely inflammatory marker.  Continue Aranesp.  # Hyperphosphatemia: Started sevelamer.  # Hypokalemia: Will manage with HD with high potassium bath.  Order a dose of KCl today.   Subjective: Seen and examined at bedside.  Tolerated dialysis well yesterday with 1.8 L ultrafiltration.  She denies nausea, vomiting, dysgeusia, chest pain or shortness of breath. Objective Vital signs in last 24 hours: Vitals:   05/18/22 2305 05/19/22 0400 05/19/22 0544 05/19/22 0739  BP: (!) 146/69 (!) 140/72  116/67  Pulse: 96 89   (!) 105  Resp: '14 14  16  '$ Temp: 98.3 F (36.8 C) 98.2 F (36.8 C)  98.2 F (36.8 C)  TempSrc: Oral Oral  Oral  SpO2: 97% 97%  96%  Weight:   60.6 kg   Height:       Weight change: 0 kg  Intake/Output Summary (Last 24 hours) at 05/19/2022 0912 Last data filed at 05/19/2022 0739 Gross per 24 hour  Intake 360 ml  Output 1800 ml  Net -1440 ml        Labs: RENAL PANEL Recent Labs    05/10/22 0038 05/11/22 0035 05/12/22 0024 05/13/22 0016 05/14/22 0028 05/15/22 0031 05/16/22 0021 05/17/22 0022 05/18/22 0014 05/19/22 0012  NA 133* 133* 133* 131* 130* 131* 130* 130* 131* 133*  K 4.3 4.0 3.5 3.0* 3.9 3.8 3.7 3.8 3.8 3.3*  CL 103 103 97* 95* 96* 97* 95* 95* 95* 96*  CO2 11* 12* 18* 25 22 20* '25 23 23 27  '$ GLUCOSE 77 92 89 100* 112* 129* 110* 106* 116* 116*  BUN 107* 107* 66* 30* 44* 54* 25* 36* 42* 27*  CREATININE 14.63* 14.31* 10.50* 7.31* 8.62* 9.53* 6.10* 7.48* 8.72* 5.91*  CALCIUM 8.4* 9.0 8.4* 8.0* 8.3* 8.4* 7.9* 8.2* 8.3* 8.4*  MG 1.7 1.7  --   --   --   --   --   --   --   --   PHOS  --  5.7* 4.8*  --   --   --  3.9 5.3* 6.3* 5.1*  ALBUMIN 2.4* 2.2* 2.3* 2.1* 2.3* 2.4* 2.3* 2.3* 2.4* 2.5*      Liver Function Tests: Recent Labs  Lab 05/13/22  0016 05/14/22 0028 05/15/22 0031 05/16/22 0021 05/17/22 0022 05/18/22 0014 05/19/22 0012  AST 79* 59* 43*  --   --   --   --   ALT 140* 74* 42  --   --   --   --   ALKPHOS 491* 426* 382*  --   --   --   --   BILITOT 2.7* 2.4* 2.2*  --   --   --   --   PROT 5.7* 6.1* 6.2*  --   --   --   --   ALBUMIN 2.1* 2.3* 2.4*   < > 2.3* 2.4* 2.5*   < > = values in this interval not displayed.    No results for input(s): "LIPASE", "AMYLASE" in the last 168 hours.  No results for input(s): "AMMONIA" in the last 168 hours. CBC: Recent Labs    05/10/22 0020 05/10/22 0038 05/11/22 0035 05/11/22 0522 05/16/22 0021 05/16/22 1542 05/17/22 0022 05/18/22 0014 05/19/22 0012  HGB  --    < > 8.7*   < > 7.0* 8.6* 7.6* 8.0*  8.1*  MCV  --    < > 90.6   < > 92.9  --  92.9 93.6 93.4  VITAMINB12  --   --  5,448*  --   --   --   --   --   --   FOLATE  --   --  14.6  --   --   --   --   --   --   FERRITIN >7,500*  --  >7,500*  --   --   --   --   --   --   TIBC 245*  --  248*  --   --   --   --   --   --   IRON 89  --  66  --   --   --   --   --   --   RETICCTPCT  --   --  2.1  --   --   --   --   --   --    < > = values in this interval not displayed.     Cardiac Enzymes: No results for input(s): "CKTOTAL", "CKMB", "CKMBINDEX", "TROPONINI" in the last 168 hours.  CBG: No results for input(s): "GLUCAP" in the last 168 hours.  Iron Studies: No results for input(s): "IRON", "TIBC", "TRANSFERRIN", "FERRITIN" in the last 72 hours. Studies/Results: US BIOPSY (KIDNEY)  Result Date: 05/17/2022 INDICATION: Acute kidney injury, ATN, proteinuria and need for renal biopsy. EXAM: ULTRASOUND GUIDED CORE BIOPSY OF LEFT KIDNEY MEDICATIONS: None. ANESTHESIA/SEDATION: Moderate (conscious) sedation was employed during this procedure. A total of Versed 1.0 mg and Fentanyl 50 mcg was administered intravenously by radiology nursing. Moderate Sedation Time: 13 minutes. The patient's level of consciousness and vital signs were monitored continuously by radiology nursing throughout the procedure under my direct supervision. PROCEDURE: The procedure, risks, benefits, and alternatives were explained to the patient. Questions regarding the procedure were encouraged and answered. The patient understands and consents to the procedure. A time-out was performed prior to initiating the procedure. Ultrasound was performed in a prone position to localize both kidneys. The left flank region was prepped with chlorhexidine in a sterile fashion, and a sterile drape was applied covering the operative field. A sterile gown and sterile gloves were used for the procedure. Local anesthesia was provided with 1% Lidocaine. Under ultrasound guidance, a 15 gauge  trocar needle was advanced to the margin of lower pole cortex of the left kidney. Two separate coaxial 16 gauge core biopsy samples were obtained and submitted in saline. A slurry of Gel-Foam pledgets was then injected via the 15 gauge needle as it was retracted and removed. Additional ultrasound was performed. COMPLICATIONS: None immediate. FINDINGS: Both kidneys were well visualized by ultrasound. The left kidney was chosen for biopsy. Solid core biopsy samples were obtained. IMPRESSION: Ultrasound-guided core biopsy performed at the level of left lower pole renal cortex. Electronically Signed   By: Aletta Edouard M.D.   On: 05/17/2022 11:59    Medications: Infusions:   Scheduled Medications:  amLODipine  5 mg Oral Daily   Chlorhexidine Gluconate Cloth  6 each Topical Q0600   [START ON 05/20/2022] darbepoetin (ARANESP) injection - NON-DIALYSIS  60 mcg Subcutaneous Q Thu-1800   Gerhardt's butt cream   Topical QID   lidocaine  1 patch Transdermal Q24H   loperamide  2 mg Oral Daily   sevelamer carbonate  800 mg Oral TID WC    have reviewed scheduled and prn medications.  Physical Exam: General:NAD, comfortable Heart:RRR, s1s2 nl Lungs:clear b/l, no crackle Abdomen:soft, Non-tender, non-distended Extremities:No edema Dialysis Access: Right IJ TDC in place  Lorrin Bodner Prasad Kieanna Rollo 05/19/2022,9:12 AM  LOS: 10 days

## 2022-05-19 NOTE — Progress Notes (Signed)
Mobility Specialist Progress Note:   05/19/22 1450  Mobility  Activity Ambulated with assistance in hallway  Level of Assistance Standby assist, set-up cues, supervision of patient - no hands on  Assistive Device Front wheel walker  Distance Ambulated (ft) 300 ft  Activity Response Tolerated well  Mobility Referral Yes  $Mobility charge 1 Mobility   Pt agreeable to mobility session. Required no physical assistance, only supervision for safety. Pt back in chair with all needs met.   Nelta Numbers Mobility Specialist Please contact via SecureChat or  Rehab office at [phone]

## 2022-05-19 NOTE — Plan of Care (Signed)
  Problem: Health Behavior/Discharge Planning: Goal: Ability to manage health-related needs will improve Outcome: Progressing   Problem: Clinical Measurements: Goal: Ability to maintain clinical measurements within normal limits will improve Outcome: Progressing Goal: Will remain free from infection Outcome: Progressing Goal: Diagnostic test results will improve Outcome: Progressing Goal: Respiratory complications will improve Outcome: Progressing Goal: Cardiovascular complication will be avoided Outcome: Progressing   Problem: Activity: Goal: Risk for activity intolerance will decrease Outcome: Progressing   Problem: Nutrition: Goal: Adequate nutrition will be maintained Outcome: Progressing   Problem: Coping: Goal: Level of anxiety will decrease Outcome: Progressing   Problem: Elimination: Goal: Will not experience complications related to bowel motility Outcome: Progressing Goal: Will not experience complications related to urinary retention Outcome: Progressing   Problem: Pain Managment: Goal: General experience of comfort will improve Outcome: Progressing   Problem: Safety: Goal: Ability to remain free from injury will improve Outcome: Progressing   Problem: Skin Integrity: Goal: Risk for impaired skin integrity will decrease Outcome: Progressing   Problem: Education: Goal: Knowledge of disease and its progression will improve Outcome: Progressing   Problem: Health Behavior/Discharge Planning: Goal: Ability to manage health-related needs will improve Outcome: Progressing   Problem: Clinical Measurements: Goal: Complications related to the disease process or treatment will be avoided or minimized Outcome: Progressing Goal: Dialysis access will remain free of complications Outcome: Progressing   Problem: Activity: Goal: Activity intolerance will improve Outcome: Progressing   Problem: Fluid Volume: Goal: Fluid volume balance will be maintained or  improved Outcome: Progressing   Problem: Nutritional: Goal: Ability to make appropriate dietary choices will improve Outcome: Progressing   Problem: Respiratory: Goal: Respiratory symptoms related to disease process will be avoided Outcome: Progressing   Problem: Self-Concept: Goal: Body image disturbance will be avoided or minimized Outcome: Progressing   Problem: Urinary Elimination: Goal: Progression of disease will be identified and treated Outcome: Progressing

## 2022-05-19 NOTE — Plan of Care (Signed)
?  Problem: Education: ?Goal: Knowledge of disease and its progression will improve ?Outcome: Progressing ?  ?Problem: Health Behavior/Discharge Planning: ?Goal: Ability to manage health-related needs will improve ?Outcome: Progressing ?  ?Problem: Clinical Measurements: ?Goal: Complications related to the disease process or treatment will be avoided or minimized ?Outcome: Progressing ?Goal: Dialysis access will remain free of complications ?Outcome: Progressing ?  ?Problem: Activity: ?Goal: Activity intolerance will improve ?Outcome: Progressing ?  ?Problem: Fluid Volume: ?Goal: Fluid volume balance will be maintained or improved ?Outcome: Progressing ?  ?Problem: Nutritional: ?Goal: Ability to make appropriate dietary choices will improve ?Outcome: Progressing ?  ?Problem: Respiratory: ?Goal: Respiratory symptoms related to disease process will be avoided ?Outcome: Progressing ?  ?Problem: Self-Concept: ?Goal: Body image disturbance will be avoided or minimized ?Outcome: Progressing ?  ?Problem: Urinary Elimination: ?Goal: Progression of disease will be identified and treated ?Outcome: Progressing ?  ?

## 2022-05-19 NOTE — Progress Notes (Signed)
New Dialysis Start (AKI)   Patient identified as new dialysis start. Kidney Education packet assembled and given. Discussed the following items with patient:     Current medications and possible changes once started:  Discussed that patient's medications may change over time.  Ex; hypertension medications and diabetes medication.  Nephrologists will adjust as needed.   Fluid restrictions reviewed:  32 oz daily goal:  All liquids count; soups, ice, jello, fruits.   Phosphorus and potassium: Handout given showing high potassium and phosphorus foods.  Alternative food and drink options given.   Family support:  none at bedside but has support at home.   Outpatient Clinic Resources:  Discussed roles of Outpatient clinic staff and advised to make a list of needs, if any, to talk with outpatient staff if needed.   Care plan schedule: Informed patient of Care Plans in outpatient setting and to participate in the care plan.  An invitation would be given from outpatient clinic. Will be monitoring AKI.    Dialysis Access Options:  Reviewed access options with patients. Discussed in detail about care at home with new AVG & AVF. Reviewed checking bruit and thrill. If dialysis catheter present, educated that patient could not take showers.  Catheter dressing changes were to be done by outpatient clinic staff only.   Home therapy options:  Educated patient about home therapy options:  PD vs home hemo.     Patient verbalized understanding. Will continue to round on patient during admission.    Aurther Loft Dialysis Nurse Coordinator [phone]

## 2022-05-19 NOTE — Progress Notes (Signed)
PROGRESS NOTE    Wendy Randolph  [id number] DOB: 1955/05/03 DOA: 05/09/2022 PCP: Patient, No Pcp Per   Brief Narrative:  67 year old F with PMH of HTN, HLD and tobacco use disorder presenting with nausea, vomiting and diarrhea for about a week and admitted for severe acute renal failure, elevated liver enzymes with markedly elevated bilirubin and lipase likely due to dehydration from GI loss in the setting of possible viral illness with concurrent NSAID use and ARB.   In ED, she had a stable vitals. Cr 14.  BUN 104.  Bicarb 15.  AST 608.  ALT 913.  ALP 770.  Total bili 20.  Direct bili 16.2.  Lipase 1118.  CT abdomen and pelvis without acute finding.  RUQ US demonstrated gallbladder sludge with CBD measuring 0.5 cm.  Patient was started on IV fluid and transferred to [facility] for further care after consultation with GI and nephrology.   Parker placed on 2/20.  HD started.  LFT improved.  Ongoing diarrhea.  Inflammatory markers elevated.  GI had been following.  Assessment & Plan:   Principal Problem:   Acute renal failure (HCC) Active Problems:   Elevated LFTs   Hyperbilirubinemia   Essential hypertension   Mixed hyperlipidemia   Infectious diarrhea   Elevated lipase   High anion gap metabolic acidosis   Bandemia   Acute GI bleeding   Diarrhea   Rectal bleeding  Acute renal failure/high anion gap metabolic acidosis/hyponatremia: Baseline Cr 0.65. BUN 104, creat 13.8. Also Tbili is 20.0 and AST 608, ALT 913, alk phos 770  AKI likely prerenal insult from dehydration due to GI loss: Concurrent use of NSAID and ARB.  CT A/P without obstruction.  UA without significant finding. -TDC placed and HD started on 2/20. -Nephrology following.  Autoimmune workup initiated per Nephrology -Pt now s/p renal biopsy 2/26, final results still pending. -Tolerating HD thus far. Outpt HD is being arranged.   Elevated liver enzymes/hyperbilirubinemia:  -Seems to be improving.  CT abdomen and pelvis and  RUQ [place] without significant finding other than some biliary sludge.  Acute hepatitis panel and HIV nonreactive. -GI now signed off  as of 2/22 -has been tolerating diet well   Elevated lipase:  -No radiologic evidence of pancreatitis or biliary stone.   -Denies drinking alcohol. -no abd pain   Infectious diarrhea:  -Tenderness improved as well. C. difficile testing and GIP negative.  Inflammatory markers elevated. -Discontinued enteric precaution -GI signed off. -Diarrhea resolved, continues to tolerate regular diet   Essential hypertension:  -Resumed home amlodipine -Continue IV labetalol as needed -Continue holding losartan -Cont HD as tolerated per Nephrology.  Blood pressure controlled.   GI bleed/normocytic anemia:  -Pt remains hemodynamically stable -Pt is s/p 1 unit PRBC transfusion 2/25 for gradual downtrend in hgb down to 7.0 -Hgb thus far remains stable   Mixed hyperlipidemia -Hold statin due to elevated liver enzymes   Leukocytosis/bandemia:  -Afebrile  -Hemodynamically stable. -blood cx neg   Hypokalemia -normalized  DVT prophylaxis: Place and maintain sequential compression device Start: 05/10/22 0934 SCDs Start: 05/09/22 2209   Code Status: Full Code  Family Communication:  None present at bedside.  Plan of care discussed with patient in length and he/she verbalized understanding and agreed with it.  Status is: Inpatient Remains inpatient appropriate because: Patient medically stable, needs outpatient HD clinic arranged   Estimated body mass index is 21.56 kg/m as calculated from the following:   Height as of this encounter: 5' 6"$  (1.676 m).  Weight as of this encounter: 60.6 kg.    Nutritional Assessment: Body mass index is 21.56 kg/m.Marland Kitchen Seen by dietician.  I agree with the assessment and plan as outlined below: Nutrition Status:        . Skin Assessment: I have examined the patient's skin and I agree with the wound assessment as performed  by the wound care RN as outlined below:    Consultants:  Nephrology GI-signed off  Procedures:  As above  Antimicrobials:  Anti-infectives (From admission, onward)    Start     Dose/Rate Route Frequency Ordered Stop   05/11/22 1250  ceFAZolin (ANCEF) IVPB 2g/100 mL premix        over 30 Minutes Intravenous Continuous PRN 05/11/22 1256 05/11/22 1250   05/11/22 1200  ceFAZolin (ANCEF) IVPB 2g/100 mL premix        2 g 200 mL/hr over 30 Minutes Intravenous To Radiology 05/11/22 1106 05/12/22 1200         Subjective: Patient seen and examined.  She has no complaints.  Objective: Vitals:   05/18/22 2305 05/19/22 0400 05/19/22 0544 05/19/22 0739  BP: (!) 146/69 (!) 140/72  116/67  Pulse: 96 89  (!) 105  Resp: 14 14  16  $ Temp: 98.3 F (36.8 C) 98.2 F (36.8 C)  98.2 F (36.8 C)  TempSrc: Oral Oral  Oral  SpO2: 97% 97%  96%  Weight:   60.6 kg   Height:        Intake/Output Summary (Last 24 hours) at 05/19/2022 1020 Last data filed at 05/19/2022 1000 Gross per 24 hour  Intake 600 ml  Output 1800 ml  Net -1200 ml   Filed Weights   05/18/22 0200 05/18/22 0830 05/19/22 0544  Weight: 63.3 kg 63.3 kg 60.6 kg    Examination:  General exam: Appears calm and comfortable  Respiratory system: Clear to auscultation. Respiratory effort normal. Cardiovascular system: S1 & S2 heard, RRR. No JVD, murmurs, rubs, gallops or clicks. No pedal edema. Gastrointestinal system: Abdomen is nondistended, soft and nontender. No organomegaly or masses felt. Normal bowel sounds heard. Central nervous system: Alert and oriented. No focal neurological deficits. Extremities: Symmetric 5 x 5 power. Skin: No rashes, lesions or ulcers Psychiatry: Judgement and insight appear normal. Mood & affect appropriate.    Data Reviewed: I have personally reviewed following labs and imaging studies  CBC: Recent Labs  Lab 05/14/22 0028 05/16/22 0021 05/16/22 1542 05/17/22 0022 05/18/22 0014  05/19/22 0012  WBC 13.7* 12.5*  --  12.3* 13.8* 14.0*  HGB 7.5* 7.0* 8.6* 7.6* 8.0* 8.1*  HCT 24.1* 21.0* 25.5* 23.5* 24.9* 24.1*  MCV 95.6 92.9  --  92.9 93.6 93.4  PLT 188 283  --  356 421* [id number]   Basic Metabolic Panel: Recent Labs  Lab 05/15/22 0031 05/16/22 0021 05/17/22 0022 05/18/22 0014 05/19/22 0012  NA 131* 130* 130* 131* 133*  K 3.8 3.7 3.8 3.8 3.3*  CL 97* 95* 95* 95* 96*  CO2 20* 25 23 23 27  $ GLUCOSE 129* 110* 106* 116* 116*  BUN 54* 25* 36* 42* 27*  CREATININE 9.53* 6.10* 7.48* 8.72* 5.91*  CALCIUM 8.4* 7.9* 8.2* 8.3* 8.4*  PHOS  --  3.9 5.3* 6.3* 5.1*   GFR: Estimated Creatinine Clearance: 8.6 mL/min (A) (by C-G formula based on SCr of 5.91 mg/dL (H)). Liver Function Tests: Recent Labs  Lab 05/13/22 0016 05/14/22 0028 05/15/22 0031 05/16/22 0021 05/17/22 0022 05/18/22 0014 05/19/22 0012  AST 79* 59* 43*  --   --   --   --  ALT 140* 74* 42  --   --   --   --   ALKPHOS 491* 426* 382*  --   --   --   --   BILITOT 2.7* 2.4* 2.2*  --   --   --   --   PROT 5.7* 6.1* 6.2*  --   --   --   --   ALBUMIN 2.1* 2.3* 2.4* 2.3* 2.3* 2.4* 2.5*   No results for input(s): "LIPASE", "AMYLASE" in the last 168 hours. No results for input(s): "AMMONIA" in the last 168 hours. Coagulation Profile: Recent Labs  Lab 05/17/22 0022  INR 0.9   Cardiac Enzymes: No results for input(s): "CKTOTAL", "CKMB", "CKMBINDEX", "TROPONINI" in the last 168 hours. BNP (last 3 results) No results for input(s): "PROBNP" in the last 8760 hours. HbA1C: No results for input(s): "HGBA1C" in the last 72 hours. CBG: No results for input(s): "GLUCAP" in the last 168 hours. Lipid Profile: No results for input(s): "CHOL", "HDL", "LDLCALC", "TRIG", "CHOLHDL", "LDLDIRECT" in the last 72 hours. Thyroid Function Tests: No results for input(s): "TSH", "T4TOTAL", "FREET4", "T3FREE", "THYROIDAB" in the last 72 hours. Anemia Panel: No results for input(s): "VITAMINB12", "FOLATE", "FERRITIN",  "TIBC", "IRON", "RETICCTPCT" in the last 72 hours. Sepsis Labs: No results for input(s): "PROCALCITON", "LATICACIDVEN" in the last 168 hours.  Recent Results (from the past 240 hour(s))  MRSA Next Gen by PCR, Nasal     Status: None   Collection Time: 05/09/22  8:15 PM   Specimen: Nasal Mucosa; Nasal Swab  Result Value Ref Range Status   MRSA by PCR Next Gen NOT DETECTED NOT DETECTED Final    Comment: (NOTE) The GeneXpert MRSA Assay (FDA approved for NASAL specimens only), is one component of a comprehensive MRSA colonization surveillance program. It is not intended to diagnose MRSA infection nor to guide or monitor treatment for MRSA infections. Test performance is not FDA approved in patients less than 40 years old. Performed at [facility], [place] [address], [place], [place] [address]   C Difficile Quick Screen w PCR reflex     Status: Abnormal   Collection Time: 05/09/22  9:50 PM   Specimen: Per Rectum; Stool  Result Value Ref Range Status   C Diff antigen POSITIVE (A) NEGATIVE Final   C Diff toxin NEGATIVE NEGATIVE Final   C Diff interpretation Results are indeterminate. See PCR results.  Final    Comment: Performed at [facility], [place] [address], [place], [place] [address]  Gastrointestinal Panel by PCR , Stool     Status: None   Collection Time: 05/09/22  9:50 PM   Specimen: Per Rectum; Stool  Result Value Ref Range Status   Campylobacter species NOT DETECTED NOT DETECTED Final   Plesimonas shigelloides NOT DETECTED NOT DETECTED Final   Salmonella species NOT DETECTED NOT DETECTED Final   Yersinia enterocolitica NOT DETECTED NOT DETECTED Final   Vibrio species NOT DETECTED NOT DETECTED Final   Vibrio cholerae NOT DETECTED NOT DETECTED Final   Enteroaggregative E coli (EAEC) NOT DETECTED NOT DETECTED Final   Enteropathogenic E coli (EPEC) NOT DETECTED NOT DETECTED Final   Enterotoxigenic E coli (ETEC) NOT DETECTED NOT DETECTED Final   Shiga like toxin  producing E coli (STEC) NOT DETECTED NOT DETECTED Final   Shigella/Enteroinvasive E coli (EIEC) NOT DETECTED NOT DETECTED Final   Cryptosporidium NOT DETECTED NOT DETECTED Final   Cyclospora cayetanensis NOT DETECTED NOT DETECTED Final   Entamoeba histolytica NOT  DETECTED NOT DETECTED Final   Giardia lamblia NOT DETECTED NOT DETECTED Final   Adenovirus F40/41 NOT DETECTED NOT DETECTED Final   Astrovirus NOT DETECTED NOT DETECTED Final   Norovirus GI/GII NOT DETECTED NOT DETECTED Final   Rotavirus A NOT DETECTED NOT DETECTED Final   Sapovirus (I, II, IV, and V) NOT DETECTED NOT DETECTED Final    Comment: Performed at [facility], [address] [place], [place] [address]  C. Diff by PCR, Reflexed     Status: None   Collection Time: 05/09/22  9:50 PM  Result Value Ref Range Status   Toxigenic C. Difficile by PCR NEGATIVE NEGATIVE Final    Comment: Patient is colonized with non toxigenic C. difficile. May not need treatment unless significant symptoms are present. Performed at [facility], [place] [address] [place], [place] [address]   Culture, blood (Routine X 2) w Reflex to ID Panel     Status: None   Collection Time: 05/11/22 10:13 AM   Specimen: BLOOD RIGHT HAND  Result Value Ref Range Status   Specimen Description BLOOD RIGHT HAND  Final   Special Requests   Final    BOTTLES DRAWN AEROBIC AND ANAEROBIC Blood Culture adequate volume   Culture   Final    NO GROWTH 5 DAYS Performed at [facility], [place] [address], [place], [place] [address]    Report Status 05/16/2022 FINAL  Final  Culture, blood (Routine X 2) w Reflex to ID Panel     Status: None   Collection Time: 05/11/22 10:15 AM   Specimen: BLOOD LEFT HAND  Result Value Ref Range Status   Specimen Description BLOOD LEFT HAND  Final   Special Requests   Final    BOTTLES DRAWN AEROBIC AND ANAEROBIC Blood Culture adequate volume   Culture   Final    NO GROWTH 5 DAYS Performed at [facility],  [place] [address], [place], [place] [address]    Report Status 05/16/2022 FINAL  Final     Radiology Studies: No results found.  Scheduled Meds:  amLODipine  5 mg Oral Daily   Chlorhexidine Gluconate Cloth  6 each Topical Q0600   [START ON 05/20/2022] darbepoetin (ARANESP) injection - NON-DIALYSIS  60 mcg Subcutaneous Q Thu-1800   Gerhardt's butt cream   Topical QID   lidocaine  1 patch Transdermal Q24H   loperamide  2 mg Oral Daily   sevelamer carbonate  800 mg Oral TID WC   Continuous Infusions:   LOS: 10 days   Darliss Cheney, MD [facility]  05/19/2022, 10:20 AM   *Please note that this is a verbal dictation therefore any spelling or grammatical errors are due to the "[place] One" system interpretation.  Please page via [place] and do not message via secure chat for urgent patient care matters. Secure chat can be used for non urgent patient care matters.  How to contact the [facility] Attending or Consulting provider [place] or covering provider during after hours [place], for this patient?  Check the care team in [facility] and look for a) attending/consulting TR[facility] provider listed and b) the [facility] team listed. Page or secure chat [phone]. Log into [email or web address] and use [place]'s universal password to access. If you do not have the password, please contact the hospital operator. Locate the [facility] provider you are looking for under [facility] and page to a number that you can be directly reached. If you still have difficulty  reaching the provider, please page the [facility] (Director on Call) for the Hospitalists listed on amion for assistance.

## 2022-05-20 ENCOUNTER — Encounter (HOSPITAL_COMMUNITY): Payer: Self-pay

## 2022-05-20 LAB — CBC
HCT: 23 % — ABNORMAL LOW (ref 36.0–46.0)
Hemoglobin: 7.5 g/dL — ABNORMAL LOW (ref 12.0–15.0)
MCH: 30.7 pg (ref 26.0–34.0)
MCHC: 32.6 g/dL (ref 30.0–36.0)
MCV: 94.3 fL (ref 80.0–100.0)
Platelets: 461 10*3/uL — ABNORMAL HIGH (ref 150–400)
RBC: 2.44 MIL/uL — ABNORMAL LOW (ref 3.87–5.11)
RDW: 13.9 % (ref 11.5–15.5)
WBC: 11.1 10*3/uL — ABNORMAL HIGH (ref 4.0–10.5)
nRBC: 0 % (ref 0.0–0.2)

## 2022-05-20 LAB — SURGICAL PATHOLOGY

## 2022-05-20 LAB — RENAL FUNCTION PANEL
Albumin: 2.7 g/dL — ABNORMAL LOW (ref 3.5–5.0)
Anion gap: 13 (ref 5–15)
BUN: 36 mg/dL — ABNORMAL HIGH (ref 8–23)
CO2: 26 mmol/L (ref 22–32)
Calcium: 8.8 mg/dL — ABNORMAL LOW (ref 8.9–10.3)
Chloride: 95 mmol/L — ABNORMAL LOW (ref 98–111)
Creatinine, Ser: 7.33 mg/dL — ABNORMAL HIGH (ref 0.44–1.00)
GFR, Estimated: 6 mL/min — ABNORMAL LOW (ref >60)
Glucose, Bld: 109 mg/dL — ABNORMAL HIGH (ref 70–99)
Phosphorus: 5.2 mg/dL — ABNORMAL HIGH (ref 2.5–4.6)
Potassium: 3.7 mmol/L (ref 3.5–5.1)
Sodium: 134 mmol/L — ABNORMAL LOW (ref 135–145)

## 2022-05-20 MED ORDER — HEPARIN SODIUM (PORCINE) 1000 UNIT/ML IJ SOLN
INTRAMUSCULAR | Status: AC
  Filled 2022-05-20: qty 4

## 2022-05-20 NOTE — Plan of Care (Signed)
Nutrition Education Note  RD consulted for renal education for AKI/HD. Pt out of room at time of RD visit. Left "Nutrition for Dialysis" handout for patient. Noted plan for pt to d/c today.  Current diet order is renal with 1200 ml fluid restriction, patient is consuming approximately 100% of meals at this time. Labs and medications reviewed. No further nutrition interventions warranted at this time. If additional nutrition issues arise, please re-consult RD.   Gustavus Bryant, MS, RD, LDN Inpatient Clinical Dietitian Please see AMiON for contact information.

## 2022-05-20 NOTE — Progress Notes (Signed)
Pt has been accepted at [facility] on TTS 11:30 chair time. Pt can start on Saturday as long as pt completes paperwork at clinic on Friday. Met with pt at bedside to provide above details. Schedule letter provided as well. Pt agreeable to plan. Arrangements added to AVS. Update provided to nephrologist (who spoke to attending) and RN CM. Contacted renal NP regarding clinic's need for orders. Clinic aware pt will d/c later today and start at clinic on Saturday. Pt aware of need to complete paperwork at clinic tomorrow and agreeable.   Melven Sartorius Renal Navigator [phone]

## 2022-05-20 NOTE — Progress Notes (Signed)
[facility] NEPHROLOGY PROGRESS NOTE  Assessment/ Plan: Pt is a 67 y.o. yo female with hypertension, HLD tobacco use presented with nausea vomiting and diarrhea found to have AKI, elevated liver enzymes.  # Acute kidney injury, dialysis dependent: Secondary to prolonged prerenal insult with nausea vomiting diarrhea and prolonged viral illness in the setting of concomitant use of ARB, NSAIDs probably leading to ATN.  UA with 3.17 g proteinuria, no hematuria.  Serologies unremarkable CT scan showed kidney cyst but no hydronephrosis. Tunneled HD catheter was placed on 2/20 by IR and received first HD on the same day. Status post kidney biopsy by IR on 2/26, the preliminary results showed only 25% cortex, severe tubular atrophy, pigment cast, Congo red stain was negative.  Awaiting final biopsy result. Patient is anuric with no sign of renal recovery therefore continue dialysis as TTS schedule.  Plan for HD today. Per Education officer, museum the outpatient dialysis was arranged at [facility] TTS schedule.  Ok to discharge.  # Transaminitis/hyperbilirubinemia seen by GI, liver enzymes and bilirubin trending down.  # Metabolic acidosis improved.  # Hyponatremia due to decreased free water excretion with AKI.  UF with HD.  # HTN/volume: Blood pressure acceptable, continue current antihypertensives.  # Anemia, due to acute illness: Extremely high ferritin likely inflammatory marker.  Continue Aranesp.  # Hyperphosphatemia: Started sevelamer.  # Hypokalemia: Will manage with HD with high potassium bath.  Order a dose of KCl today.   Subjective: Seen and examined at bedside.  No new event.  Denies nausea, vomiting, chest pain, shortness of breath.  Discussed with Education officer, museum and primary team.  Confirming outpatient HD and then patient will be discharged home.  Objective Vital signs in last 24 hours: Vitals:   05/19/22 2323 05/20/22 0349 05/20/22 0507 05/20/22 0738  BP: (!)  131/57 125/62  124/66  Pulse: 96 92  98  Resp: '12 12  15  '$ Temp: 98.6 F (37 C) 98.4 F (36.9 C)  98.4 F (36.9 C)  TempSrc: Oral Oral  Oral  SpO2: 97% 97%  95%  Weight:   61.1 kg   Height:       Weight change: -2.2 kg  Intake/Output Summary (Last 24 hours) at 05/20/2022 0909 Last data filed at 05/20/2022 0700 Gross per 24 hour  Intake 600 ml  Output 550 ml  Net 50 ml        Labs: RENAL PANEL Recent Labs    05/10/22 0038 05/11/22 0035 05/12/22 0024 05/13/22 0016 05/14/22 0028 05/15/22 0031 05/16/22 0021 05/17/22 0022 05/18/22 0014 05/19/22 0012 05/20/22 0017  NA 133* 133* 133* 131* 130* 131* 130* 130* 131* 133* 134*  K 4.3 4.0 3.5 3.0* 3.9 3.8 3.7 3.8 3.8 3.3* 3.7  CL 103 103 97* 95* 96* 97* 95* 95* 95* 96* 95*  CO2 11* 12* 18* 25 22 20* '25 23 23 27 26  '$ GLUCOSE 77 92 89 100* 112* 129* 110* 106* 116* 116* 109*  BUN 107* 107* 66* 30* 44* 54* 25* 36* 42* 27* 36*  CREATININE 14.63* 14.31* 10.50* 7.31* 8.62* 9.53* 6.10* 7.48* 8.72* 5.91* 7.33*  CALCIUM 8.4* 9.0 8.4* 8.0* 8.3* 8.4* 7.9* 8.2* 8.3* 8.4* 8.8*  MG 1.7 1.7  --   --   --   --   --   --   --   --   --   PHOS  --  5.7* 4.8*  --   --   --  3.9 5.3* 6.3* 5.1* 5.2*  ALBUMIN 2.4* 2.2* 2.3* 2.1* 2.3* 2.4* 2.3* 2.3* 2.4* 2.5* 2.7*      Liver Function Tests: Recent Labs  Lab 05/14/22 0028 05/15/22 0031 05/16/22 0021 05/18/22 0014 05/19/22 0012 05/20/22 0017  AST 59* 43*  --   --   --   --   ALT 74* 42  --   --   --   --   ALKPHOS 426* 382*  --   --   --   --   BILITOT 2.4* 2.2*  --   --   --   --   PROT 6.1* 6.2*  --   --   --   --   ALBUMIN 2.3* 2.4*   < > 2.4* 2.5* 2.7*   < > = values in this interval not displayed.    No results for input(s): "LIPASE", "AMYLASE" in the last 168 hours.  No results for input(s): "AMMONIA" in the last 168 hours. CBC: Recent Labs    05/10/22 0020 05/10/22 0038 05/11/22 0035 05/11/22 0522 05/16/22 0021 05/16/22 1542 05/17/22 0022 05/18/22 0014  05/19/22 0012  HGB  --    < > 8.7*   < > 7.0* 8.6* 7.6* 8.0* 8.1*  MCV  --    < > 90.6   < > 92.9  --  92.9 93.6 93.4  VITAMINB12  --   --  5,448*  --   --   --   --   --   --   FOLATE  --   --  14.6  --   --   --   --   --   --   FERRITIN >7,500*  --  >7,500*  --   --   --   --   --   --   TIBC 245*  --  248*  --   --   --   --   --   --   IRON 89  --  66  --   --   --   --   --   --   RETICCTPCT  --   --  2.1  --   --   --   --   --   --    < > = values in this interval not displayed.     Cardiac Enzymes: No results for input(s): "CKTOTAL", "CKMB", "CKMBINDEX", "TROPONINI" in the last 168 hours.  CBG: No results for input(s): "GLUCAP" in the last 168 hours.  Iron Studies: No results for input(s): "IRON", "TIBC", "TRANSFERRIN", "FERRITIN" in the last 72 hours. Studies/Results: No results found.  Medications: Infusions:   Scheduled Medications:  amLODipine  5 mg Oral Daily   Chlorhexidine Gluconate Cloth  6 each Topical Q0600   darbepoetin (ARANESP) injection - NON-DIALYSIS  60 mcg Subcutaneous Q Thu-1800   Gerhardt's butt cream   Topical QID   lidocaine  1 patch Transdermal Q24H   loperamide  2 mg Oral Daily   sevelamer carbonate  800 mg Oral TID WC    have reviewed scheduled and prn medications.  Physical Exam: General:NAD, comfortable Heart:RRR, s1s2 nl Lungs:clear b/l, no crackle Abdomen:soft, Non-tender, non-distended Extremities:No edema Dialysis Access: Right IJ TDC in place  Tanner Vigna Prasad Karishma Unrein 05/20/2022,9:09 AM  LOS: 11 days

## 2022-05-20 NOTE — Discharge Summary (Signed)
Physician Discharge Summary  Wendy Randolph [id number] DOB: 1956-03-04 DOA: 05/09/2022  PCP: Patient, No Pcp Per  Admit date: 05/09/2022 Discharge date: 05/20/2022 30 Day Unplanned Readmission Risk Score    Flowsheet Row ED to Hosp-Admission (Current) from 05/09/2022 in [place] [facility]  30 Day Unplanned Readmission Risk Score (%) 16.39 Filed at 05/20/2022 0801       This score is the patient's risk of an unplanned readmission within 30 days of being discharged (0 -100%). The score is based on dignosis, age, lab data, medications, orders, and past utilization.   Low:  0-14.9   Medium: 15-21.9   High: 22-29.9   Extreme: 30 and above          Admitted From: Home Disposition: Home  Recommendations for Outpatient Follow-up:  Follow up with PCP in 1-2 weeks Please obtain BMP/CBC in one week Follow-up with the dialysis center for dialysis on Tuesdays, Thursdays and Saturdays Please follow up with your PCP on the following pending results: Unresulted Labs (From admission, onward)     Start     Ordered   05/16/22 0500  Renal function panel  Daily,   R     Question:  Specimen collection method  Answer:  Lab=Lab collect   05/15/22 0554   Signed and Held  Renal function panel  Once,   R       Question:  Specimen collection method  Answer:  Lab=Lab collect   Signed and Held   Signed and Held  CBC  Once,   R       Question:  Specimen collection method  Answer:  Lab=Lab collect   Signed and Held              Home Health: Yes Equipment/Devices: None  Discharge Condition: Stable CODE STATUS: Full code Diet recommendation: Cardiac/renal  Subjective: Seen and.  She has no complaints.  Brief/Interim Summary: 67 year old F with PMH of HTN, HLD and tobacco use disorder presenting with nausea, vomiting and diarrhea for about a week and admitted for severe acute renal failure, elevated liver enzymes with markedly elevated bilirubin and lipase likely due to  dehydration from GI loss in the setting of possible viral illness with concurrent NSAID use and ARB.   In ED, she had a stable vitals. Cr 14.  BUN 104.  Bicarb 15.  AST 608.  ALT 913.  ALP 770.  Total bili 20.  Direct bili 16.2.  Lipase 1118.  CT abdomen and pelvis without acute finding.  RUQ US demonstrated gallbladder sludge with CBD measuring 0.5 cm.  Patient was started on IV fluid and transferred to [facility] for further care after consultation with GI and nephrology.   Ferrelview placed on 2/20.  HD started.  LFT improved.    Acute renal failure/high anion gap metabolic acidosis/hyponatremia: Baseline Cr 0.65. BUN 104, creat 13.8. Also Tbili is 20.0 and AST 608, ALT 913, alk phos 770  AKI likely prerenal insult from dehydration due to GI loss: Concurrent use of NSAID and ARB.  CT A/P without obstruction.  UA without significant finding. -TDC placed and HD started on 2/20. - Autoimmune workup initiated per Nephrology -Pt now s/p renal biopsy 2/26, final results still pending. -Tolerating HD thus far. Outpt HD is arranged on TTS schedule.  She is cleared from nephrology and thus she is going to be discharged today.   Elevated liver enzymes/hyperbilirubinemia:  -Seems to be improving.  CT abdomen and pelvis and RUQ [place] without significant  finding other than some biliary sludge.  Acute hepatitis panel and HIV nonreactive. -GI now signed off  as of 2/22 -has been tolerating diet well   Elevated lipase:  -No radiologic evidence of pancreatitis or biliary stone.   -Denies drinking alcohol. -no abd pain   Infectious diarrhea:  -Tenderness improved as well. C. difficile testing and GIP negative.  Inflammatory markers elevated. -Discontinued enteric precaution -GI signed off. -Diarrhea resolved, continues to tolerate regular diet   Essential hypertension:  -Resumed home amlodipine but losartan remained on hold during the hospitalization and blood pressure remained controlled.  She is being discharged only  on amlodipine for now.   GI bleed/normocytic anemia:  -Pt remains hemodynamically stable -Pt is s/p 1 unit PRBC transfusion 2/25 for gradual downtrend in hgb down to 7.0 -Hgb thus far remains stable   Mixed hyperlipidemia -Hold statin due to elevated liver enzymes   Leukocytosis/bandemia:  -Afebrile  -Hemodynamically stable. -blood cx neg   Hypokalemia -normalized  Discharge plan was discussed with patient and/or family member and they verbalized understanding and agreed with it.  Discharge Diagnoses:  Principal Problem:   Acute renal failure (HCC) Active Problems:   Elevated LFTs   Hyperbilirubinemia   Essential hypertension   Mixed hyperlipidemia   Infectious diarrhea   Elevated lipase   High anion gap metabolic acidosis   Bandemia   Acute GI bleeding   Diarrhea   Rectal bleeding    Discharge Instructions   Allergies as of 05/20/2022   No Known Allergies      Medication List     STOP taking these medications    ibuprofen 200 MG tablet Commonly known as: ADVIL   losartan 50 MG tablet Commonly known as: COZAAR       TAKE these medications    amLODipine 5 MG tablet Commonly known as: NORVASC Take 5 mg by mouth daily.   IRON PO Take 1 tablet by mouth daily.   latanoprost 0.005 % ophthalmic solution Commonly known as: XALATAN Place 1 drop into both eyes at bedtime.   pantoprazole 40 MG tablet Commonly known as: PROTONIX Take 40 mg by mouth daily.   rosuvastatin 40 MG tablet Commonly known as: CRESTOR Take 40 mg by mouth daily.   VITAMIN D PO Take 1 tablet by mouth daily.               Durable Medical Equipment  (From admission, onward)           Start     Ordered   05/14/22 1144  For home use only DME Bedside commode  Once       Question:  Patient needs a bedside commode to treat with the following condition  Answer:  Weakness   05/14/22 1143   05/14/22 1142  For home use only DME Walker rolling  Once       Question  Answer Comment  Walker: With 5 Inch Wheels   Patient needs a walker to treat with the following condition Weakness      05/14/22 1142            Follow-up Information     [facility] Follow up.   Why: BSC and rolling walker Contact information: [id number]        [facility] Follow up.   Specialty: Home Health Services Why: Home health has been arranged. They will contact you to schedule apt in 1 to 2 days post discharge. Contact information: [address] [place] [address] [phone]         PCP Follow up in 1 week(s).                 No Known Allergies  Consultations: Nephrology and IR   Procedures/Studies: US BIOPSY (KIDNEY)  Result Date: 05/17/2022 INDICATION: Acute kidney injury, ATN, proteinuria and need for renal biopsy. EXAM: ULTRASOUND GUIDED CORE BIOPSY OF LEFT KIDNEY MEDICATIONS: None. ANESTHESIA/SEDATION: Moderate (conscious) sedation was employed during this procedure. A total of Versed 1.0 mg and Fentanyl 50 mcg was administered intravenously by radiology nursing. Moderate Sedation Time: 13 minutes. The patient's level of consciousness and vital signs were monitored continuously by radiology nursing throughout the procedure under my direct supervision. PROCEDURE: The procedure, risks, benefits, and alternatives were explained to the patient. Questions regarding the procedure were encouraged and answered. The patient understands and consents to the procedure. A time-out was performed prior to initiating the procedure. Ultrasound was performed in a prone position to localize both kidneys. The left flank region was prepped with chlorhexidine in a sterile fashion, and a sterile drape was applied covering the operative field. A sterile gown and sterile gloves were used for the procedure. Local anesthesia was provided with 1% Lidocaine. Under ultrasound guidance, a 15 gauge trocar needle was advanced to the margin of lower pole  cortex of the left kidney. Two separate coaxial 16 gauge core biopsy samples were obtained and submitted in saline. A slurry of Gel-Foam pledgets was then injected via the 15 gauge needle as it was retracted and removed. Additional ultrasound was performed. COMPLICATIONS: None immediate. FINDINGS: Both kidneys were well visualized by ultrasound. The left kidney was chosen for biopsy. Solid core biopsy samples were obtained. IMPRESSION: Ultrasound-guided core biopsy performed at the level of left lower pole renal cortex. Electronically Signed   By: Aletta Edouard M.D.   On: 05/17/2022 11:59   IR Fluoro Guide CV Line Right  Result Date: 05/11/2022 INDICATION: 19 year old with acute kidney injury.  Patient needs hemodialysis. EXAM: FLUOROSCOPIC AND ULTRASOUND GUIDED PLACEMENT OF A TUNNELED DIALYSIS CATHETER Physician: Stephan Minister. Anselm Pancoast, MD MEDICATIONS: Ancef 2 g; The antibiotic was administered within an appropriate time interval prior to skin puncture. ANESTHESIA/SEDATION: Moderate (conscious) sedation was employed during this procedure. A total of Versed '1mg'$  and fentanyl 25 mcg was administered intravenously at the order of the provider performing the procedure. Total intra-service moderate sedation time: 17 minutes. Patient's level of consciousness and vital signs were monitored continuously by radiology nurse throughout the procedure under the supervision of the provider performing the procedure. FLUOROSCOPY TIME:  Radiation Exposure Index (as provided by the fluoroscopic device): 1 mGy Kerma COMPLICATIONS: None immediate. PROCEDURE: The procedure was explained to the patient. The risks and benefits of the procedure were discussed and the patient's questions were addressed. Informed consent was obtained from the patient. The patient was placed supine on the interventional table. Ultrasound confirmed a patent right internal jugular vein. Ultrasound image obtained for documentation. The right neck and chest was  prepped and draped in a sterile fashion. Maximal barrier sterile technique was utilized including caps, mask, sterile gowns, sterile gloves, sterile drape, hand hygiene and skin antiseptic. The right neck was anesthetized with 1% lidocaine. A small incision was made with #11 blade scalpel. A 21 gauge needle directed into the right internal jugular vein with ultrasound guidance. A micropuncture dilator set was placed. A 19 cm tip to cuff Palindrome catheter was selected. The skin below the right clavicle was anesthetized  and a small incision was made with an #11 blade scalpel. A subcutaneous tunnel was formed to the vein dermatotomy site. The catheter was brought through the tunnel. The vein dermatotomy site was dilated to accommodate a peel-away sheath. The catheter was placed through the peel-away sheath and directed into the central venous structures. The tip of the catheter was placed at superior cavoatrial junction with fluoroscopy. Fluoroscopic images were obtained for documentation. Both lumens were found to aspirate and flush well. The proper amount of heparin was flushed in both lumens. The vein dermatotomy site was closed using a single layer of absorbable suture and Dermabond. The catheter was secured to the skin using Prolene suture. IMPRESSION: Successful placement of a right jugular tunneled dialysis catheter using ultrasound and fluoroscopic guidance. Electronically Signed   By: Markus Daft M.D.   On: 05/11/2022 13:53   IR US Guide Vasc Access Right  Result Date: 05/11/2022 INDICATION: 10 year old with acute kidney injury.  Patient needs hemodialysis. EXAM: FLUOROSCOPIC AND ULTRASOUND GUIDED PLACEMENT OF A TUNNELED DIALYSIS CATHETER Physician: Stephan Minister. Anselm Pancoast, MD MEDICATIONS: Ancef 2 g; The antibiotic was administered within an appropriate time interval prior to skin puncture. ANESTHESIA/SEDATION: Moderate (conscious) sedation was employed during this procedure. A total of Versed '1mg'$  and fentanyl 25  mcg was administered intravenously at the order of the provider performing the procedure. Total intra-service moderate sedation time: 17 minutes. Patient's level of consciousness and vital signs were monitored continuously by radiology nurse throughout the procedure under the supervision of the provider performing the procedure. FLUOROSCOPY TIME:  Radiation Exposure Index (as provided by the fluoroscopic device): 1 mGy Kerma COMPLICATIONS: None immediate. PROCEDURE: The procedure was explained to the patient. The risks and benefits of the procedure were discussed and the patient's questions were addressed. Informed consent was obtained from the patient. The patient was placed supine on the interventional table. Ultrasound confirmed a patent right internal jugular vein. Ultrasound image obtained for documentation. The right neck and chest was prepped and draped in a sterile fashion. Maximal barrier sterile technique was utilized including caps, mask, sterile gowns, sterile gloves, sterile drape, hand hygiene and skin antiseptic. The right neck was anesthetized with 1% lidocaine. A small incision was made with #11 blade scalpel. A 21 gauge needle directed into the right internal jugular vein with ultrasound guidance. A micropuncture dilator set was placed. A 19 cm tip to cuff Palindrome catheter was selected. The skin below the right clavicle was anesthetized and a small incision was made with an #11 blade scalpel. A subcutaneous tunnel was formed to the vein dermatotomy site. The catheter was brought through the tunnel. The vein dermatotomy site was dilated to accommodate a peel-away sheath. The catheter was placed through the peel-away sheath and directed into the central venous structures. The tip of the catheter was placed at superior cavoatrial junction with fluoroscopy. Fluoroscopic images were obtained for documentation. Both lumens were found to aspirate and flush well. The proper amount of heparin was flushed  in both lumens. The vein dermatotomy site was closed using a single layer of absorbable suture and Dermabond. The catheter was secured to the skin using Prolene suture. IMPRESSION: Successful placement of a right jugular tunneled dialysis catheter using ultrasound and fluoroscopic guidance. Electronically Signed   By: Markus Daft M.D.   On: 05/11/2022 13:53   US Abdomen Limited RUQ (LIVER/GB)  Result Date: 05/09/2022 CLINICAL DATA:  Right upper quadrant pain, jaundice EXAM: ULTRASOUND ABDOMEN LIMITED RIGHT UPPER QUADRANT COMPARISON:  Same-day CT FINDINGS:  Gallbladder: Sludge in the gallbladder. No discrete gallstones. No gallbladder wall thickening. No sonographic Murphy sign noted by sonographer. Common bile duct: Diameter: 0.5 cm Liver: No focal lesion identified. Within normal limits in parenchymal echogenicity. Portal vein is patent on color Doppler imaging with normal direction of blood flow towards the liver. Other: None. IMPRESSION: Gallbladder sludge without ultrasound evidence of acute cholecystitis. Consider nuclear scintigraphic HIDA scan to further evaluate if there is clinical concern for cholecystitis or biliary ductal obstruction. Electronically Signed   By: Delanna Ahmadi M.D.   On: 05/09/2022 17:52   CT ABDOMEN PELVIS WO CONTRAST  Result Date: 05/09/2022 CLINICAL DATA:  67 year old female with history of acute onset of abdominal pain. Left renal failure. Vomiting and diarrhea. EXAM: CT ABDOMEN AND PELVIS WITHOUT CONTRAST TECHNIQUE: Multidetector CT imaging of the abdomen and pelvis was performed following the standard protocol without IV contrast. RADIATION DOSE REDUCTION: This exam was performed according to the departmental dose-optimization program which includes automated exposure control, adjustment of the mA and/or kV according to patient size and/or use of iterative reconstruction technique. COMPARISON:  CTA of the abdomen and pelvis 04/17/2016. FINDINGS: Lower chest: Atherosclerosis in  the descending thoracic aorta as well as the right coronary artery. Hepatobiliary: No definite suspicious cystic or solid hepatic lesions are confidently identified on today's noncontrast CT examination. There is a small amount of amorphous intermediate attenuation material lying dependently in the gallbladder, likely to represent biliary sludge. Gallbladder is moderately distended. Gallbladder wall does not appear thickened. No pericholecystic fluid or surrounding inflammatory changes. Pancreas: No definite pancreatic mass or peripancreatic fluid collections or inflammatory changes are noted on today's noncontrast CT examination. Spleen: Unremarkable. Adrenals/Urinary Tract: In the anterior aspect of the lower pole the left kidney there is a 1.5 cm low-attenuation lesion, incompletely characterized on today's noncontrast CT examination, but statistically likely to represent cysts (no imaging follow-up recommended). Right kidney and bilateral adrenal glands are otherwise normal in appearance. No hydroureteronephrosis. Urinary bladder is nearly completely decompressed, but otherwise unremarkable in appearance. Stomach/Bowel: The unenhanced appearance of the stomach is normal. No pathologic dilatation of small bowel or colon. Numerous colonic diverticuli are noted, without surrounding inflammatory changes to indicate an acute diverticulitis at this time. Normal appendix. Vascular/Lymphatic: Atherosclerotic calcifications are noted in the abdominal aorta. No lymphadenopathy noted in the abdomen or pelvis. Reproductive: Status post hysterectomy. Ovaries are not confidently identified may be surgically absent or atrophic. Other: No significant volume of ascites.  No pneumoperitoneum. Musculoskeletal: There are no aggressive appearing lytic or blastic lesions noted in the visualized portions of the skeleton. IMPRESSION: 1. No acute findings are noted in the abdomen or pelvis to account for the patient's symptoms. 2. Small  amount of biliary sludge lying dependently in the gallbladder. No imaging findings to suggest an acute cholecystitis at this time. 3. Colonic diverticulosis without evidence of acute diverticulitis at this time. 4. Aortic atherosclerosis. 5. Additional incidental findings, as above. Electronically Signed   By: Vinnie Langton M.D.   On: 05/09/2022 16:43     Discharge Exam: Vitals:   05/20/22 0349 05/20/22 0738  BP: 125/62 124/66  Pulse: 92 98  Resp: 12 15  Temp: 98.4 F (36.9 C) 98.4 F (36.9 C)  SpO2: 97% 95%   Vitals:   05/19/22 2323 05/20/22 0349 05/20/22 0507 05/20/22 0738  BP: (!) 131/57 125/62  124/66  Pulse: 96 92  98  Resp: '12 12  15  '$ Temp: 98.6 F (37 C) 98.4 F (36.9 C)  98.4 F (36.9 C)  TempSrc: Oral Oral  Oral  SpO2: 97% 97%  95%  Weight:   61.1 kg   Height:        General: Pt is alert, awake, not in acute distress Cardiovascular: RRR, S1/S2 +, no rubs, no gallops Respiratory: CTA bilaterally, no wheezing, no rhonchi Abdominal: Soft, NT, ND, bowel sounds + Extremities: no edema, no cyanosis    The results of significant diagnostics from this hospitalization (including imaging, microbiology, ancillary and laboratory) are listed below for reference.     Microbiology: Recent Results (from the past 240 hour(s))  Culture, blood (Routine X 2) w Reflex to ID Panel     Status: None   Collection Time: 05/11/22 10:13 AM   Specimen: BLOOD RIGHT HAND  Result Value Ref Range Status   Specimen Description BLOOD RIGHT HAND  Final   Special Requests   Final    BOTTLES DRAWN AEROBIC AND ANAEROBIC Blood Culture adequate volume   Culture   Final    NO GROWTH 5 DAYS Performed at [facility], [place] [address] [place], [place] [address]    Report Status 05/16/2022 FINAL  Final  Culture, blood (Routine X 2) w Reflex to ID Panel     Status: None   Collection Time: 05/11/22 10:15 AM   Specimen: BLOOD LEFT HAND  Result Value Ref Range Status   Specimen Description  BLOOD LEFT HAND  Final   Special Requests   Final    BOTTLES DRAWN AEROBIC AND ANAEROBIC Blood Culture adequate volume   Culture   Final    NO GROWTH 5 DAYS Performed at [facility], [place] [address] [place], [place] [address]    Report Status 05/16/2022 FINAL  Final     Labs: BNP (last 3 results) No results for input(s): "BNP" in the last 8760 hours. Basic Metabolic Panel: Recent Labs  Lab 05/16/22 0021 05/17/22 0022 05/18/22 0014 05/19/22 0012 05/20/22 0017  NA 130* 130* 131* 133* 134*  K 3.7 3.8 3.8 3.3* 3.7  CL 95* 95* 95* 96* 95*  CO2 '25 23 23 27 26  '$ GLUCOSE 110* 106* 116* 116* 109*  BUN 25* 36* 42* 27* 36*  CREATININE 6.10* 7.48* 8.72* 5.91* 7.33*  CALCIUM 7.9* 8.2* 8.3* 8.4* 8.8*  PHOS 3.9 5.3* 6.3* 5.1* 5.2*   Liver Function Tests: Recent Labs  Lab 05/14/22 0028 05/15/22 0031 05/16/22 0021 05/17/22 0022 05/18/22 0014 05/19/22 0012 05/20/22 0017  AST 59* 43*  --   --   --   --   --   ALT 74* 42  --   --   --   --   --   ALKPHOS 426* 382*  --   --   --   --   --   BILITOT 2.4* 2.2*  --   --   --   --   --   PROT 6.1* 6.2*  --   --   --   --   --   ALBUMIN 2.3* 2.4* 2.3* 2.3* 2.4* 2.5* 2.7*   No results for input(s): "LIPASE", "AMYLASE" in the last 168 hours. No results for input(s): "AMMONIA" in the last 168 hours. CBC: Recent Labs  Lab 05/14/22 0028 05/16/22 0021 05/16/22 1542 05/17/22 0022 05/18/22 0014 05/19/22 0012  WBC 13.7* 12.5*  --  12.3* 13.8* 14.0*  HGB 7.5* 7.0* 8.6* 7.6* 8.0* 8.1*  HCT 24.1* 21.0* 25.5* 23.5* 24.9* 24.1*  MCV 95.6 92.9  --  92.9 93.6 93.4  PLT 188 283  --  356 421* 393   Cardiac Enzymes: No results for input(s): "CKTOTAL", "CKMB", "CKMBINDEX", "TROPONINI" in the last 168 hours. BNP: Invalid input(s): "POCBNP" CBG: No results for input(s): "GLUCAP" in the last 168 hours. D-Dimer No results for input(s): "DDIMER" in the last 72 hours. Hgb A1c No results for input(s): "HGBA1C" in the last 72 hours. Lipid  Profile No results for input(s): "CHOL", "HDL", "LDLCALC", "TRIG", "CHOLHDL", "LDLDIRECT" in the last 72 hours. Thyroid function studies No results for input(s): "TSH", "T4TOTAL", "T3FREE", "THYROIDAB" in the last 72 hours.  Invalid input(s): "FREET3" Anemia work up No results for input(s): "VITAMINB12", "FOLATE", "FERRITIN", "TIBC", "IRON", "RETICCTPCT" in the last 72 hours. Urinalysis    Component Value Date/Time   COLORURINE YELLOW 05/10/2022 1111   APPEARANCEUR CLEAR 05/10/2022 1111   LABSPEC 1.020 05/10/2022 1111   PHURINE 5.5 05/10/2022 1111   GLUCOSEU 100 (A) 05/10/2022 1111   HGBUR LARGE (A) 05/10/2022 1111   BILIRUBINUR MODERATE (A) 05/10/2022 1111   KETONESUR NEGATIVE 05/10/2022 1111   PROTEINUR 100 (A) 05/10/2022 1111   NITRITE NEGATIVE 05/10/2022 1111   LEUKOCYTESUR NEGATIVE 05/10/2022 1111   Sepsis Labs Recent Labs  Lab 05/16/22 0021 05/17/22 0022 05/18/22 0014 05/19/22 0012  WBC 12.5* 12.3* 13.8* 14.0*   Microbiology Recent Results (from the past 240 hour(s))  Culture, blood (Routine X 2) w Reflex to ID Panel     Status: None   Collection Time: 05/11/22 10:13 AM   Specimen: BLOOD RIGHT HAND  Result Value Ref Range Status   Specimen Description BLOOD RIGHT HAND  Final   Special Requests   Final    BOTTLES DRAWN AEROBIC AND ANAEROBIC Blood Culture adequate volume   Culture   Final    NO GROWTH 5 DAYS Performed at [facility], [place] [address] [place], [place] [address]    Report Status 05/16/2022 FINAL  Final  Culture, blood (Routine X 2) w Reflex to ID Panel     Status: None   Collection Time: 05/11/22 10:15 AM   Specimen: BLOOD LEFT HAND  Result Value Ref Range Status   Specimen Description BLOOD LEFT HAND  Final   Special Requests   Final    BOTTLES DRAWN AEROBIC AND ANAEROBIC Blood Culture adequate volume   Culture   Final    NO GROWTH 5 DAYS Performed at [facility], [place] [address] [place],  [address]    Report Status  05/16/2022 FINAL  Final     Time coordinating discharge: Over 30 minutes  SIGNED:   Darliss Cheney, MD  [facility] 05/20/2022, 9:34 AM *Please note that this is a verbal dictation therefore any spelling or grammatical errors are due to the "[place] [employer]" system interpretation. If 7PM-7AM, please contact night-coverage [email or web address]

## 2022-05-20 NOTE — TOC Transition Note (Signed)
Transition of Care [facility]) - CM/SW Discharge Note   Patient Details  Name: Wendy Randolph MRN: [id number] Date of Birth: 12-21-1955  Transition of Care [facility]) CM/SW Contact:  Verdell Carmine, RN Phone Number: 05/20/2022, 1:06 PM   Clinical Narrative:     Patient has been clipped to a center and will be DC today.  Cory from [place] aware of DC today. BSC and walker will be sent to room prior to DC by [employer].    Final next level of care: [place] Barriers to Discharge: No Barriers Identified   Patient Goals and CMS Choice      Discharge Placement                         Discharge Plan and Services Additional resources added to the After Visit Summary for     Discharge Planning Services: CM Consult Post Acute Care Choice: Home Health, Dialysis                    HH Arranged: PT, OT [facility] Agency: [place] Date [place]: 05/20/22 Time [place] Agency Contacted: [phone] Representative spoke with at [place]: Adela Lank  Social Determinants of Health (SDOH) Interventions SDOH Screenings   Tobacco Use: High Risk (05/11/2022)     Readmission Risk Interventions     No data to display

## 2022-05-20 NOTE — Progress Notes (Signed)
Physical Therapy Treatment Patient Details Name: Wendy Randolph MRN: [id number] DOB: 1955/07/22 Today's Date: 05/20/2022   History of Present Illness 67 yo female admitted 2/18 with N/V/D and acute renal failure. 2/20 TDC placed and HD initiated. PMhx: HTN, HLD, perirectal abscess.    PT Comments    Pt received in supine, agreeable to therapy session and with good participation and tolerance for transfer and gait training with balance challenges. She scored 13/24 on Dynamic Gait Index using RW. Scores of 19 or less are predictive of falls in older community living adults, pt would benefit from PRN supervision/assist from family once home and continued HHPT to reduce falls risk. Pt making good progress toward her goals, plan to continue stair training next session and higher level balance challenges if pt remains in acute care. Case management contacted to confirm if DME would be delivered to room as she may DC tonight per chart review.   Recommendations for follow up therapy are one component of a multi-disciplinary discharge planning process, led by the attending physician.  Recommendations may be updated based on patient status, additional functional criteria and insurance authorization.  Follow Up Recommendations  Home health PT     Assistance Recommended at Discharge PRN  Patient can return home with the following A little help with walking and/or transfers;A little help with bathing/dressing/bathroom;Assistance with cooking/housework;Assist for transportation;Help with stairs or ramp for entrance   Equipment Recommendations  Rolling walker (2 wheels);BSC/3in1 (needs BSC due to bowel/bladder urgency at night)    Recommendations for Other Services       Precautions / Restrictions Precautions Precautions: Fall Restrictions Weight Bearing Restrictions: No     Mobility  Bed Mobility Overal bed mobility: Modified Independent Bed Mobility: Supine to Sit     Supine to sit:  Modified independent (Device/Increase time)     General bed mobility comments: no cues or physical assist needed    Transfers Overall transfer level: Needs assistance Equipment used: Rolling walker (2 wheels) Transfers: Sit to/from Stand Sit to Stand: Supervision           General transfer comment: from EOB, to chair    Ambulation/Gait Ambulation/Gait assistance: Supervision Gait Distance (Feet): 360 Feet Assistive device: Rolling walker (2 wheels) Gait Pattern/deviations: Step-through pattern, Decreased stride length, Decreased dorsiflexion - right, Decreased dorsiflexion - left Gait velocity: grossly 0.2-0.35 m/s     General Gait Details: SpO2 not reading despite sensor location change (noisy signal) but pt not dypsneic and no acute s/sx distress. HR 99-112 bpm with exertion. Pt shuffling first 76f in room due to slippers donned, pt agreeable to doff slippers and just use hospital socks and demos improved safer gait pattern with improved heel strike. Decreased step length but pt able to improve somewhat with cues. Difficulty with significant speed changes during DGI (see below). More stable this session using RW   Stairs             Wheelchair Mobility    Modified Rankin (Stroke Patients Only)       Balance Overall balance assessment: Needs assistance Sitting-balance support: Feet supported, No upper extremity supported Sitting balance-Leahy Scale: Good     Standing balance support: Single extremity supported, No upper extremity supported Standing balance-Leahy Scale: Fair Standing balance comment: static standing no AD without LOB, steadier for balance challenges using RW                 Standardized Balance Assessment Standardized Balance Assessment : Dynamic Gait Index   Dynamic  Gait Index Level Surface: Mild Impairment Change in Gait Speed: Moderate Impairment Gait with Horizontal Head Turns: Mild Impairment Gait with Vertical Head Turns:  Mild Impairment Gait and Pivot Turn: Mild Impairment Step Over Obstacle: Mild Impairment Step Around Obstacles: Moderate Impairment Steps: Moderate Impairment Total Score: 13      Cognition Arousal/Alertness: Awake/alert Behavior During Therapy: WFL for tasks assessed/performed Overall Cognitive Status: Within Functional Limits for tasks assessed                                          Exercises      General Comments        Pertinent Vitals/Pain Pain Assessment Pain Assessment: No/denies pain    Home Living                          Prior Function            PT Goals (current goals can now be found in the care plan section) Acute Rehab PT Goals 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 PT Goal Formulation: With patient Time For Goal Achievement: 05/27/22 Progress towards PT goals: Progressing toward goals    Frequency    Min 3X/week      PT Plan Current plan remains appropriate    Co-evaluation              AM-PAC PT "6 Clicks" Mobility   Outcome Measure  Help needed turning from your back to your side while in a flat bed without using bedrails?: None Help needed moving from lying on your back to sitting on the side of a flat bed without using bedrails?: None Help needed moving to and from a bed to a chair (including a wheelchair)?: A Little Help needed standing up from a chair using your arms (e.g., wheelchair or bedside chair)?: A Little Help needed to walk in hospital room?: A Little Help needed climbing 3-5 steps with a railing? : A Little 6 Click Score: 20    End of Session Equipment Utilized During Treatment: Gait belt Activity Tolerance: Patient tolerated treatment well Patient left: in chair;with call bell/phone within reach (pt modI for transfers now and low fall risk so kept chair alarm off as she has been able to pivot to [facility] safely unassisted when having urinary/bowel  urgency) Nurse Communication: Mobility status PT Visit Diagnosis: Other abnormalities of gait and mobility (R26.89);Muscle weakness (generalized) (M62.81)     Time: [id number] PT Time Calculation (min) (ACUTE ONLY): 15 min  Charges:  $Gait Training: 8-22 mins                     Thomes Burak P., PTA Acute Rehabilitation Services Secure Chat Preferred 9a-5:30pm Office: [place] 05/20/2022, 11:24 AM

## 2022-05-20 NOTE — Progress Notes (Signed)
Received patient in bed to unit.  Alert and oriented.  Informed consent signed and in chart.   [place] duration:3h 5 min  Patient tolerated well.  Transported back to the room  Alert, without acute distress.  Hand-off given to patient's nurse.   Access used: Catheter Access issues: lines reversed  Total UF removed: 1.6L Medication(s) given: none Post HD VS: 11152/62,100,96%,97.7 Post HD weight: 62.7kg   [facility]

## 2022-05-20 NOTE — Progress Notes (Signed)
Patient returned from HD and ready for discharge.  Discharge information provided to patient and she states understanding of discharge education.  Belongings taken with patient.
